# Patient Record
Sex: Male | Born: 1955 | Race: White | Hispanic: No | Marital: Married | Smoking: Never smoker
Health system: Southern US, Community
[De-identification: ages and names within clinical notes are randomized; demographics above are authoritative.]

## PROBLEM LIST (undated history)

## (undated) DIAGNOSIS — K219 Gastro-esophageal reflux disease without esophagitis: Secondary | ICD-10-CM

## (undated) DIAGNOSIS — D649 Anemia, unspecified: Secondary | ICD-10-CM

## (undated) DIAGNOSIS — Z5189 Encounter for other specified aftercare: Secondary | ICD-10-CM

## (undated) DIAGNOSIS — E785 Hyperlipidemia, unspecified: Secondary | ICD-10-CM

## (undated) DIAGNOSIS — E538 Deficiency of other specified B group vitamins: Secondary | ICD-10-CM

## (undated) DIAGNOSIS — K449 Diaphragmatic hernia without obstruction or gangrene: Secondary | ICD-10-CM

## (undated) HISTORY — DX: Gastro-esophageal reflux disease without esophagitis: K21.9

## (undated) HISTORY — DX: Hyperlipidemia, unspecified: E78.5

## (undated) HISTORY — PX: MOUTH SURGERY: SHX715

## (undated) HISTORY — DX: Diaphragmatic hernia without obstruction or gangrene: K44.9

## (undated) HISTORY — DX: Deficiency of other specified B group vitamins: E53.8

## (undated) HISTORY — DX: Anemia, unspecified: D64.9

## (undated) HISTORY — DX: Encounter for other specified aftercare: Z51.89

## (undated) HISTORY — PX: OTHER SURGICAL HISTORY: SHX169

---

## 2012-12-11 ENCOUNTER — Encounter (HOSPITAL_COMMUNITY): Payer: Self-pay | Admitting: Emergency Medicine

## 2012-12-11 ENCOUNTER — Emergency Department (INDEPENDENT_AMBULATORY_CARE_PROVIDER_SITE_OTHER)
Admission: EM | Admit: 2012-12-11 | Discharge: 2012-12-11 | Disposition: A | Discharge status: Home / Self Care | Payer: BC Managed Care – PPO | Source: Home / Self Care | Attending: Family Medicine | Admitting: Family Medicine

## 2012-12-11 DIAGNOSIS — L723 Sebaceous cyst: Secondary | ICD-10-CM

## 2012-12-11 DIAGNOSIS — L089 Local infection of the skin and subcutaneous tissue, unspecified: Secondary | ICD-10-CM

## 2012-12-11 MED ORDER — DOXYCYCLINE HYCLATE 100 MG PO CAPS: 100.0000 mg | ORAL_CAPSULE | Freq: Two times a day (BID) | ORAL | Status: DC

## 2012-12-11 NOTE — ED Notes (Signed)
Pt  Reports  Symptoms  Of   Boil  On  Neck       X  2  Week   Pt    Reports  It is  Draining  As  Well      The  Area  Is swollen tender     And  Draining

## 2012-12-11 NOTE — ED Provider Notes (Signed)
Gregory Duncan is a 57 y.o. male who presents to Urgent Care today for right sided neck skin abscess. Patient has a history of a small cyst in the right side of his neck he'll intermittently swell and resolved. However about one or 2 weeks ago it started worsening and became very red and painful. His wife was able to express white cheesy foul-smelling material from it today. He notes however that his pain is worsened. He denies any fevers or chills nausea vomiting or diarrhea. He has tried some pain medications which have not worked very well. He also has used antibiotic ointment which has not worked. He feels well otherwise with no known medical problems.   History reviewed. No pertinent past medical history. History  Substance Use Topics  . Smoking status: Not on file  . Smokeless tobacco: Not on file  . Alcohol Use: No   ROS as above Medications reviewed. No current facility-administered medications for this encounter.   Current Outpatient Prescriptions  Medication Sig Dispense Refill  . doxycycline (VIBRAMYCIN) 100 MG capsule Take 1 capsule (100 mg total) by mouth 2 (two) times daily.  20 capsule  0    Exam:  BP 161/94  Pulse 64  Temp(Src) 97.8 F (36.6 C) (Oral)  Resp 16  SpO2 99% Gen: Well NAD Skin: Large 2 cm area of fluctuance with mild surrounding induration and erythema. Tender to touch. This is located on the right side of his neck.  Exam is otherwise benign.   INCISION AND DRAINAGE Performed by: Clementeen Graham, S Consent: Verbal consent obtained. Risks and benefits: risks, benefits and alternatives were discussed Type: abscess  Body area: Right neck skin  Anesthesia: local infiltration  Incision was made with a scalpel.  Local anesthetic: lidocaine 2 % with epinephrine  Anesthetic total: 3 ml  Complexity: Simple Blunt dissection to break up loculations  Drainage: Small amount of purulent sebaceous material. The majority with serous sanguinous.   Drainage  amount: 2 mL   Packing material: 1/2 in iodoform gauze. Approximately 2 inches   Patient tolerance: Patient tolerated the procedure well with no immediate complications.      Assessment and Plan: 57 y.o. male with right neck infected sebaceous cyst. The cyst had become partially drained by the time the patient presented to the clinic.  I cultured the material and packed the wound. I recommend the patient followup with a general surgeon in about one month for definitive removal of the remaining sebaceous cyst. Additionally I will treat patient empirically with doxycycline and provided a work excuse note. Discussed warning signs or symptoms. Please see discharge instructions. Patient expresses understanding.      Rodolph Bong, MD 12/11/12 [phone]

## 2012-12-14 ENCOUNTER — Telehealth (HOSPITAL_COMMUNITY): Payer: Self-pay | Admitting: Family Medicine

## 2012-12-14 ENCOUNTER — Telehealth (HOSPITAL_COMMUNITY): Payer: Self-pay | Admitting: *Deleted

## 2012-12-14 LAB — CULTURE, ROUTINE-ABSCESS: Special Requests: NORMAL

## 2012-12-14 MED ORDER — CIPROFLOXACIN HCL 500 MG PO TABS: 500.0000 mg | ORAL_TABLET | Freq: Two times a day (BID) | ORAL | Status: DC

## 2012-12-14 NOTE — Telephone Encounter (Signed)
Message copied by Rodolph Bong on Thu Dec 14, 2012  6:25 PM ------      Message from: Vassie Moselle      Created: Thu Dec 14, 2012  3:23 PM      Regarding: lab       Abscess culture: Few Proteus Mirabilis.  Treated with Doxycycline- not on sensitivity.  Is pt. adequately treated?       Vassie Moselle      12/14/2012             ------

## 2012-12-14 NOTE — ED Notes (Signed)
Pt. called back.  Pt. verified x 2 and given results.  Pt. told to stop the Doxycycline and take the Cipro.  Pt. told where to pick up his Rx. Vassie Moselle 12/14/2012

## 2012-12-14 NOTE — ED Notes (Signed)
Culture results show Proteus.  Plan to switch to Cipro RN to call patient  Rodolph Bong, MD 12/14/12 [phone]

## 2012-12-14 NOTE — ED Notes (Signed)
Dr. Denyse Amass changed Rx. to Cipro.  I called pt. and left a message to call. Vassie Moselle 12/14/2012

## 2013-01-10 ENCOUNTER — Encounter (INDEPENDENT_AMBULATORY_CARE_PROVIDER_SITE_OTHER): Payer: Self-pay | Admitting: General Surgery

## 2013-01-10 ENCOUNTER — Ambulatory Visit (INDEPENDENT_AMBULATORY_CARE_PROVIDER_SITE_OTHER): Payer: BC Managed Care – PPO | Admitting: General Surgery

## 2013-01-10 ENCOUNTER — Telehealth (INDEPENDENT_AMBULATORY_CARE_PROVIDER_SITE_OTHER): Payer: Self-pay | Admitting: General Surgery

## 2013-01-10 VITALS — BP 120/80 | HR 56 | Temp 98.2°F | Resp 14 | Ht 68.0 in | Wt 192.0 lb

## 2013-01-10 DIAGNOSIS — L089 Local infection of the skin and subcutaneous tissue, unspecified: Secondary | ICD-10-CM

## 2013-01-10 DIAGNOSIS — L720 Epidermal cyst: Secondary | ICD-10-CM

## 2013-01-10 DIAGNOSIS — L723 Sebaceous cyst: Secondary | ICD-10-CM

## 2013-01-10 MED ORDER — DOXYCYCLINE HYCLATE 100 MG PO CAPS: 100.0000 mg | ORAL_CAPSULE | Freq: Two times a day (BID) | ORAL | Status: DC

## 2013-01-10 NOTE — Progress Notes (Signed)
Patient ID: Gregory Duncan, male   DOB: Jul 13, 1955, 57 y.o.   MRN: [id number]  Chief Complaint  Patient presents with  . Cyst    scalp    HPI Gregory Duncan is a 57 y.o. male.  HPI 57 yo WM referred by Dr Denyse Amass for evaluation of sebaceous cysts of the neck. Patient states that he has had the cyst on the right side of his neck as long as since 2005. It will generally swell and go down. It will occasionally drain spontaneously. A few weeks ago in early November it started spontaneously draining but was very tender and swollen and he went to urgent care where he was given antibiotics as well as incision of the area. He was referred here for definitive management. He states he also has a bump on the back of his neck as well. He denies any fevers or chills. He denies any weight loss. He denies lymphadenopathy. He denies injury to the area.  Past Medical History  Diagnosis Date  . GERD (gastroesophageal reflux disease)     History reviewed. No pertinent past surgical history.  History reviewed. No pertinent family history.  Social History History  Substance Use Topics  . Smoking status: Never Smoker   . Smokeless tobacco: Not on file  . Alcohol Use: No    No Known Allergies  Current Outpatient Prescriptions  Medication Sig Dispense Refill  . doxycycline (VIBRAMYCIN) 100 MG capsule Take 1 capsule (100 mg total) by mouth 2 (two) times daily.  14 capsule  0   No current facility-administered medications for this visit.    Review of Systems Review of Systems  Constitutional: Negative for fever, chills, appetite change and unexpected weight change.  HENT: Negative for congestion and trouble swallowing.   Eyes: Negative for visual disturbance.  Respiratory: Negative for chest tightness and shortness of breath.   Cardiovascular: Negative for chest pain and leg swelling.       No PND, no orthopnea, no DOE  Gastrointestinal:       See HPI  Genitourinary: Negative for dysuria and  hematuria.  Musculoskeletal: Negative.   Skin: Negative for rash.  Neurological: Negative for seizures and speech difficulty.       Denies TIAs, amaurosis fugax  Hematological: Does not bruise/bleed easily.  Psychiatric/Behavioral: Negative for behavioral problems and confusion.    Blood pressure 120/80, pulse 56, temperature 98.2 F (36.8 C), temperature source Temporal, resp. rate 14, height 5\' 8"  (1.727 m), weight 192 lb (87.091 kg).  Physical Exam Physical Exam  Vitals reviewed. Constitutional: He is oriented to person, place, and time. He appears well-developed and well-nourished. No distress.  HENT:  Head: Normocephalic and atraumatic.  Right Ear: External ear normal.  Left Ear: External ear normal.  Eyes: Conjunctivae are normal. No scleral icterus.  Neck: Normal range of motion. Neck supple. No tracheal deviation present. No thyromegaly present.    Posterior neck - firm, well circumscribed, mobile, subcu mass about 1.5cm, no cellulitis; Rt lateral neck - healed incision, about 2cm area of induration, no fluctuance. +cellulitis.   Cardiovascular: Normal rate and normal heart sounds.   Pulmonary/Chest: Effort normal and breath sounds normal. No respiratory distress. He has no wheezes.  Abdominal: He exhibits no distension.  Musculoskeletal: Normal range of motion.  Lymphadenopathy:       Head (right side): No preauricular, no posterior auricular and no occipital adenopathy present.       Head (left side): No preauricular, no posterior auricular and no  occipital adenopathy present.    He has no cervical adenopathy.       Right: No supraclavicular adenopathy present.       Left: No supraclavicular adenopathy present.  Neurological: He is alert and oriented to person, place, and time. He exhibits normal muscle tone.  Skin: Skin is warm and dry. No rash noted. He is not diaphoretic. No erythema. No pallor.  Psychiatric: He has a normal mood and affect. His behavior is normal.  Judgment and thought content normal.    Data Reviewed Dr Zollie Pee note 12/11/12  Assessment    Right lateral neck infected epidermal inclusion cyst Posterior neck epidermal inclusion cyst     Plan    These areas are consistent with epidermoid inclusion cyst also known as sebaceous cyst. The one on the right side of his neck he appears still a little bit indurated with some mild cellulitis. It does not appear to need drainage. However I think he needs oral antibiotics for it. I renewed his prescription for doxycycline. With respect to definitive management, we discussed management of sebaceous cyst. We discussed the only way to prevent a recurrence is to excise the entire cyst with its sac. We discussed the surgical staff as well as the postoperative care. We discussed the risk and benefits of surgery including but not limited to bleeding, infection, injury to surrounding structures, recurrence, anesthesia complications, wound complications, scarring and the typical postoperative recovery course. He is very anxious about proceeding with getting them excised as soon as possible. I explained that if we proceed at this time point that the one on the right side of the neck has increased risk of postoperative wound infection given his current cellulitis. Nonetheless he understands the risk and would like to proceed with surgery this week. I told him we would try to accommodate his request and get him scheduled for Friday afternoon under monitored anesthesia care. If not we will try to find another operative time next week. All of his questions were asked and answered  Mary Sella. Andrey Campanile, MD, FACS General, Bariatric, & Minimally Invasive Surgery [facility], [place]        [facility] M 01/10/2013, 9:14 AM

## 2013-01-10 NOTE — Telephone Encounter (Signed)
Pt made aware of [facility] financial obligation will call back to schedule  °

## 2013-01-10 NOTE — Patient Instructions (Signed)
Pick up antibiotic at pharmacy  Epidermal Cyst An epidermal cyst is sometimes called a sebaceous cyst, epidermal inclusion cyst, or infundibular cyst. These cysts usually contain a substance that looks "pasty" or "cheesy" and may have a bad smell. This substance is a protein called keratin. Epidermal cysts are usually found on the face, neck, or trunk. They may also occur in the vaginal area or other parts of the genitalia of both men and women. Epidermal cysts are usually small, painless, slow-growing bumps or lumps that move freely under the skin. It is important not to try to pop them. This may cause an infection and lead to tenderness and swelling. CAUSES  Epidermal cysts may be caused by a deep penetrating injury to the skin or a plugged hair follicle, often associated with acne. SYMPTOMS  Epidermal cysts can become inflamed and cause:  Redness.  Tenderness.  Increased temperature of the skin over the bumps or lumps.  Grayish-white, bad smelling material that drains from the bump or lump. DIAGNOSIS  Epidermal cysts are easily diagnosed by your caregiver during an exam. Rarely, a tissue sample (biopsy) may be taken to rule out other conditions that may resemble epidermal cysts. TREATMENT   Epidermal cysts often get better and disappear on their own. They are rarely ever cancerous.  If a cyst becomes infected, it may become inflamed and tender. This may require opening and draining the cyst. Treatment with antibiotics may be necessary. When the infection is gone, the cyst may be removed with minor surgery.  Small, inflamed cysts can often be treated with antibiotics or by injecting steroid medicines.  Sometimes, epidermal cysts become large and bothersome. If this happens, surgical removal in your caregiver's office may be necessary. HOME CARE INSTRUCTIONS  Only take over-the-counter or prescription medicines as directed by your caregiver.  Take your antibiotics as directed. Finish  them even if you start to feel better. SEEK MEDICAL CARE IF:   Your cyst becomes tender, red, or swollen.  Your condition is not improving or is getting worse.  You have any other questions or concerns. MAKE SURE YOU:  Understand these instructions.  Will watch your condition.  Will get help right away if you are not doing well or get worse. Document Released: 12/20/2003 Document Revised: 04/12/2011 Document Reviewed: 07/27/2010 [facility] Patient Information 2014 [place], [place].

## 2016-10-19 ENCOUNTER — Encounter (HOSPITAL_COMMUNITY): Payer: Self-pay | Admitting: *Deleted

## 2016-10-19 ENCOUNTER — Inpatient Hospital Stay (HOSPITAL_COMMUNITY)
Admission: EM | Admit: 2016-10-19 | Discharge: 2016-10-22 | DRG: 810 | Disposition: A | Discharge status: Home / Self Care | Payer: BLUE CROSS/BLUE SHIELD | Attending: Internal Medicine | Admitting: Internal Medicine

## 2016-10-19 DIAGNOSIS — D61818 Other pancytopenia: Secondary | ICD-10-CM | POA: Diagnosis not present

## 2016-10-19 DIAGNOSIS — K219 Gastro-esophageal reflux disease without esophagitis: Secondary | ICD-10-CM | POA: Diagnosis present

## 2016-10-19 DIAGNOSIS — R531 Weakness: Secondary | ICD-10-CM | POA: Diagnosis not present

## 2016-10-19 DIAGNOSIS — E538 Deficiency of other specified B group vitamins: Secondary | ICD-10-CM | POA: Diagnosis present

## 2016-10-19 DIAGNOSIS — Z6825 Body mass index (BMI) 25.0-25.9, adult: Secondary | ICD-10-CM

## 2016-10-19 DIAGNOSIS — D649 Anemia, unspecified: Secondary | ICD-10-CM | POA: Diagnosis present

## 2016-10-19 DIAGNOSIS — R634 Abnormal weight loss: Secondary | ICD-10-CM | POA: Diagnosis present

## 2016-10-19 LAB — PREPARE RBC (CROSSMATCH)

## 2016-10-19 LAB — CBC
HCT: 17.8 % — ABNORMAL LOW (ref 39.0–52.0)
HEMATOCRIT: 18.5 % — AB (ref 39.0–52.0)
HEMOGLOBIN: 6.6 g/dL — AB (ref 13.0–17.0)
Hemoglobin: 6.4 g/dL — CL (ref 13.0–17.0)
MCH: 38.6 pg — ABNORMAL HIGH (ref 26.0–34.0)
MCH: 38.6 pg — ABNORMAL HIGH (ref 26.0–34.0)
MCHC: 35.7 g/dL (ref 30.0–36.0)
MCHC: 36 g/dL (ref 30.0–36.0)
MCV: 108.2 fL — ABNORMAL HIGH (ref 78.0–100.0)
MCV: 107.2 fL — AB (ref 78.0–100.0)
Platelets: 40 10*3/uL — ABNORMAL LOW (ref 150–400)
Platelets: 26 10*3/uL — CL (ref 150–400)
RBC: 1.71 MIL/uL — ABNORMAL LOW (ref 4.22–5.81)
RBC: 1.66 MIL/uL — ABNORMAL LOW (ref 4.22–5.81)
RDW: 16.9 % — ABNORMAL HIGH (ref 11.5–15.5)
RDW: 17.1 % — ABNORMAL HIGH (ref 11.5–15.5)
WBC: 1.7 10*3/uL — ABNORMAL LOW (ref 4.0–10.5)
WBC: 1.3 10*3/uL — AB (ref 4.0–10.5)

## 2016-10-19 LAB — BASIC METABOLIC PANEL
Anion gap: 8 (ref 5–15)
BUN: 21 mg/dL — ABNORMAL HIGH (ref 6–20)
CO2: 27 mmol/L (ref 22–32)
Calcium: 8.8 mg/dL — ABNORMAL LOW (ref 8.9–10.3)
Chloride: 100 mmol/L — ABNORMAL LOW (ref 101–111)
Creatinine, Ser: 0.78 mg/dL (ref 0.61–1.24)
GFR calc Af Amer: >60 mL/min (ref >60)
Glucose, Bld: 127 mg/dL — ABNORMAL HIGH (ref 65–99)
POTASSIUM: 4.6 mmol/L (ref 3.5–5.1)
SODIUM: 135 mmol/L (ref 135–145)

## 2016-10-19 MED ORDER — SODIUM CHLORIDE 0.9 % IV SOLN
Freq: Once | INTRAVENOUS | Status: AC
  Administered 2016-10-20: 01:00:00 via INTRAVENOUS

## 2016-10-19 NOTE — ED Notes (Signed)
Date and time results received: 10/19/16 7:30 PM (use smartphrase ".now" to insert current time)  Test: Hbg Critical Value: 6.6  Name of Provider Notified: Dr. Oleta Mouse   Orders Received? Or Actions Taken?: verbal order type and screen

## 2016-10-19 NOTE — ED Triage Notes (Signed)
To ED for eval of weakness for the past week. Pt appears very pale. Denies pain or discomfort in abd. Denies blood in stools or dark stools. No vomiting. Does complain of pain in between shoulder blades. No cp

## 2016-10-19 NOTE — ED Provider Notes (Signed)
[facility] DEPT Provider Note   CSN: [id number] Arrival date & time: 10/19/16  1646     History   Chief Complaint Chief Complaint  Patient presents with  . Weakness    HPI Gregory Duncan is a 61 y.o. male.  Patient with PMH of GERD presents to the ED with a chief complaint of weakness.  He states that he has been weak, fatigued, and short of breath for the past several weeks. States that his symptoms have been gradually worsening.  He denies any black stools.  States that he had some vomiting in the waiting room, and has not had an appetite for the past several weeks.  He reports having some pain in his epigastrium, which he states feels similar to his GERD.   The history is provided by the patient. No language interpreter was used.    Past Medical History:  Diagnosis Date  . GERD (gastroesophageal reflux disease)     There are no active problems to display for this patient.   History reviewed. No pertinent surgical history.     Home Medications    Prior to Admission medications   Medication Sig Start Date End Date Taking? Authorizing Provider  doxycycline (VIBRAMYCIN) 100 MG capsule Take 1 capsule (100 mg total) by mouth 2 (two) times daily. 01/10/13   Greer Pickerel, MD    Family History No family history on file.  Social History Social History  Substance Use Topics  . Smoking status: Never Smoker  . Smokeless tobacco: Not on file  . Alcohol use No     Allergies   Patient has no known allergies.   Review of Systems Review of Systems  All other systems reviewed and are negative.    Physical Exam Updated Vital Signs BP 120/79   Pulse 88   Temp 99 F (37.2 C) (Oral)   Resp 15   SpO2 100%   Physical Exam  Constitutional: He is oriented to person, place, and time. He appears well-developed and well-nourished.  Pale, ill appearing  HENT:  Head: Normocephalic and atraumatic.  Eyes: Pupils are equal, round, and reactive to light.  Conjunctivae and EOM are normal. Right eye exhibits no discharge. Left eye exhibits no discharge. No scleral icterus.  Neck: Normal range of motion. Neck supple. No JVD present.  Cardiovascular: Regular rhythm and normal heart sounds.  Exam reveals no gallop and no friction rub.   No murmur heard. Mildly tachycardic  Pulmonary/Chest: Effort normal and breath sounds normal. No respiratory distress. He has no wheezes. He has no rales. He exhibits no tenderness.  Abdominal: Soft. He exhibits no distension and no mass. There is no tenderness. There is no rebound and no guarding.  Musculoskeletal: Normal range of motion. He exhibits no edema or tenderness.  Neurological: He is alert and oriented to person, place, and time.  Skin: Skin is warm and dry.  Psychiatric: He has a normal mood and affect. His behavior is normal. Judgment and thought content normal.  Nursing note and vitals reviewed.    ED Treatments / Results  Labs (all labs ordered are listed, but only abnormal results are displayed) Labs Reviewed  BASIC METABOLIC PANEL - Abnormal; Notable for the following:       Result Value   Chloride 100 (*)    Glucose, Bld 127 (*)    BUN 21 (*)    Calcium 8.8 (*)    All other components within normal limits  CBC - Abnormal; Notable for the following:  WBC 1.7 (*)    RBC 1.71 (*)    Hemoglobin 6.6 (*)    HCT 18.5 (*)    MCV 108.2 (*)    MCH 38.6 (*)    RDW 16.9 (*)    Platelets 40 (*)    All other components within normal limits  TROPONIN I - Abnormal; Notable for the following:    Troponin I 0.03 (*)    All other components within normal limits  CBC - Abnormal; Notable for the following:    WBC 1.3 (*)    RBC 1.66 (*)    Hemoglobin 6.4 (*)    HCT 17.8 (*)    MCV 107.2 (*)    MCH 38.6 (*)    RDW 17.1 (*)    Platelets 26 (*)    All other components within normal limits  URINALYSIS, ROUTINE W REFLEX MICROSCOPIC  DIFFERENTIAL  SAVE SMEAR  PATHOLOGIST SMEAR REVIEW  HIV  ANTIBODY (ROUTINE TESTING)  HEMOGLOBIN AND HEMATOCRIT, BLOOD  VITAMIN B12  IRON AND TIBC  RETICULOCYTES  FOLATE  FERRITIN  DIC (DISSEMINATED INTRAVASCULAR COAGULATION) PANEL  CBG MONITORING, ED  I-STAT CG4 LACTIC ACID, ED  I-STAT CG4 LACTIC ACID, ED  TYPE AND SCREEN  PREPARE RBC (CROSSMATCH)  ABO/RH    EKG  EKG Interpretation  Date/Time:  Tuesday October 19 2016 18:16:38 EDT Ventricular Rate:  64 PR Interval:  122 QRS Duration: 94 QT Interval:  396 QTC Calculation: 408 R Axis:   57 Text Interpretation:  Normal sinus rhythm T wave abnormality, consider inferolateral ischemia Abnormal ECG no prior EKG  Confirmed by Brantley Stage [phone]) on 10/19/2016 6:23:45 PM       Radiology No results found.  Procedures Procedures (including critical care time) CRITICAL CARE Performed by: Montine Circle   Total critical care time: 47 minutes  Critical care time was exclusive of separately billable procedures and treating other patients.  Critical care was necessary to treat or prevent imminent or life-threatening deterioration.  Critical care was time spent personally by me on the following activities: development of treatment plan with patient and/or surrogate as well as nursing, discussions with consultants, evaluation of patient's response to treatment, examination of patient, obtaining history from patient or surrogate, ordering and performing treatments and interventions, ordering and review of laboratory studies, ordering and review of radiographic studies, pulse oximetry and re-evaluation of patient's condition.  Medications Ordered in ED Medications  0.9 %  sodium chloride infusion (not administered)     Initial Impression / Assessment and Plan / ED Course  I have reviewed the triage vital signs and the nursing notes.  Pertinent labs & imaging results that were available during my care of the patient were reviewed by me and considered in my medical decision making (see  chart for details).     Patient with pancytopenia, generalized weakness x several weeks, lack of appetite.  Describes some acid reflux type symptoms, but denies any other pain.  There are no other associated symptoms.  Will transfuse the patient given his symptomatic anemia.  Appreciate Dr. Alcario Drought for admitting the patient.  Final Clinical Impressions(s) / ED Diagnoses   Final diagnoses:  Symptomatic anemia  Pancytopenia [facility] [place])    New Prescriptions New Prescriptions   No medications on file     Montine Circle, Hershal Coria 10/20/16 0210    Tegeler, Gwenyth Allegra, MD 10/20/16 [phone]

## 2016-10-20 ENCOUNTER — Emergency Department (HOSPITAL_COMMUNITY): Payer: BLUE CROSS/BLUE SHIELD

## 2016-10-20 ENCOUNTER — Encounter (HOSPITAL_COMMUNITY): Payer: Self-pay | Admitting: *Deleted

## 2016-10-20 DIAGNOSIS — E538 Deficiency of other specified B group vitamins: Secondary | ICD-10-CM

## 2016-10-20 DIAGNOSIS — D649 Anemia, unspecified: Secondary | ICD-10-CM

## 2016-10-20 DIAGNOSIS — D61818 Other pancytopenia: Secondary | ICD-10-CM | POA: Diagnosis present

## 2016-10-20 LAB — BASIC METABOLIC PANEL
ANION GAP: 6 (ref 5–15)
BUN: 17 mg/dL (ref 6–20)
CHLORIDE: 101 mmol/L (ref 101–111)
CO2: 28 mmol/L (ref 22–32)
CREATININE: 0.73 mg/dL (ref 0.61–1.24)
Calcium: 8.6 mg/dL — ABNORMAL LOW (ref 8.9–10.3)
GFR calc non Af Amer: >60 mL/min (ref >60)
Glucose, Bld: 120 mg/dL — ABNORMAL HIGH (ref 65–99)
POTASSIUM: 3.7 mmol/L (ref 3.5–5.1)
SODIUM: 135 mmol/L (ref 135–145)

## 2016-10-20 LAB — IRON AND TIBC
IRON: 205 ug/dL — AB (ref 45–182)
Saturation Ratios: 85 % — ABNORMAL HIGH (ref 17.9–39.5)
TIBC: 241 ug/dL — ABNORMAL LOW (ref 250–450)
UIBC: 36 ug/dL

## 2016-10-20 LAB — PATHOLOGIST SMEAR REVIEW

## 2016-10-20 LAB — I-STAT CG4 LACTIC ACID, ED: Lactic Acid, Venous: 1.16 mmol/L (ref 0.5–1.9)

## 2016-10-20 LAB — DIC (DISSEMINATED INTRAVASCULAR COAGULATION) PANEL
INR: 1.17
PLATELETS: 24 10*3/uL — AB (ref 150–400)
PROTHROMBIN TIME: 14.8 s (ref 11.4–15.2)

## 2016-10-20 LAB — FOLATE: FOLATE: 16.3 ng/mL (ref >5.9)

## 2016-10-20 LAB — URINALYSIS, ROUTINE W REFLEX MICROSCOPIC
BACTERIA UA: NONE SEEN
Glucose, UA: NEGATIVE mg/dL
Hgb urine dipstick: NEGATIVE
KETONES UR: 20 mg/dL — AB
Leukocytes, UA: NEGATIVE
Nitrite: NEGATIVE
PH: 5 (ref 5.0–8.0)
PROTEIN: 30 mg/dL — AB
RBC / HPF: NONE SEEN RBC/hpf (ref 0–5)
Specific Gravity, Urine: 1.023 (ref 1.005–1.030)

## 2016-10-20 LAB — RETICULOCYTES
RBC.: 1.72 MIL/uL — ABNORMAL LOW (ref 4.22–5.81)
RETIC COUNT ABSOLUTE: 15.5 10*3/uL — AB (ref 19.0–186.0)
Retic Ct Pct: 0.9 % (ref 0.4–3.1)

## 2016-10-20 LAB — DIC (DISSEMINATED INTRAVASCULAR COAGULATION)PANEL
D-Dimer, Quant: 1.01 ug/mL-FEU — ABNORMAL HIGH (ref 0.00–0.50)
Fibrinogen: 255 mg/dL (ref 210–475)
aPTT: 31 seconds (ref 24–36)

## 2016-10-20 LAB — SAVE SMEAR

## 2016-10-20 LAB — ABO/RH: ABO/RH(D): A POS

## 2016-10-20 LAB — HEMOGLOBIN AND HEMATOCRIT, BLOOD
HCT: 23.8 % — ABNORMAL LOW (ref 39.0–52.0)
Hemoglobin: 8.6 g/dL — ABNORMAL LOW (ref 13.0–17.0)

## 2016-10-20 LAB — HIV ANTIBODY (ROUTINE TESTING W REFLEX): HIV Screen 4th Generation wRfx: NONREACTIVE

## 2016-10-20 LAB — VITAMIN B12

## 2016-10-20 LAB — FERRITIN: FERRITIN: 205 ng/mL (ref 24–336)

## 2016-10-20 MED ORDER — ACETAMINOPHEN 650 MG RE SUPP: 650.0000 mg | Freq: Four times a day (QID) | RECTAL | Status: DC | PRN

## 2016-10-20 MED ORDER — ACETAMINOPHEN 325 MG PO TABS: 650.0000 mg | ORAL_TABLET | Freq: Four times a day (QID) | ORAL | Status: DC | PRN

## 2016-10-20 MED ORDER — ONDANSETRON HCL 4 MG/2ML IJ SOLN
4.0000 mg | Freq: Four times a day (QID) | INTRAMUSCULAR | Status: DC | PRN
  Administered 2016-10-21: 4 mg via INTRAVENOUS
  Filled 2016-10-20: qty 2

## 2016-10-20 MED ORDER — ENOXAPARIN SODIUM 40 MG/0.4ML ~~LOC~~ SOLN: 40.0000 mg | SUBCUTANEOUS | Status: DC

## 2016-10-20 MED ORDER — ONDANSETRON HCL 4 MG PO TABS: 4.0000 mg | ORAL_TABLET | Freq: Four times a day (QID) | ORAL | Status: DC | PRN

## 2016-10-20 MED ORDER — CYANOCOBALAMIN 1000 MCG/ML IJ SOLN
1000.0000 ug | Freq: Every day | INTRAMUSCULAR | Status: DC
  Administered 2016-10-20 – 2016-10-22 (×3): 1000 ug via INTRAMUSCULAR
  Filled 2016-10-20 (×3): qty 1

## 2016-10-20 MED ORDER — FOLIC ACID 5 MG/ML IJ SOLN
1.0000 mg | Freq: Every day | INTRAMUSCULAR | Status: DC
  Administered 2016-10-20 – 2016-10-22 (×3): 1 mg via INTRAVENOUS
  Filled 2016-10-20 (×3): qty 0.2

## 2016-10-20 NOTE — H&P (Signed)
History and Physical    Gregory Duncan XFG:[id number] DOB: 10-23-1955 DOA: 10/19/2016  PCP: Patient, No Pcp Per  Patient coming from: Home  I have personally briefly reviewed patient's old medical records in [place]  Chief Complaint: Generalized Weakness  HPI: Gregory Duncan is a 61 y.o. male with medical history significant of GERD.  Patient presents to the ED with c/o 1 week history of worsening weakness.  Apparently has insidious onset of generalized weakness and fatigue over past couple of weeks.  Worsening over past 1 week.  Nothing makes better or worse, nothing tried for symptoms.   ED Course: Pancytopenia with WBC 1.3, HGB 6.4, platelets 26.  2 unit PRBC transfusion ordered.  Macrocytosis noted.  Renal function and mental status nl.   Review of Systems: As per HPI otherwise 10 point review of systems negative.   Past Medical History:  Diagnosis Date  . GERD (gastroesophageal reflux disease)     History reviewed. No pertinent surgical history.   reports that he has never smoked. He does not have any smokeless tobacco history on file. He reports that he does not drink alcohol or use drugs.  No Known Allergies  No family history on file. No family history of bone marrow problems.  Prior to Admission medications   Medication Sig Start Date End Date Taking? Authorizing Provider  bismuth subsalicylate (PEPTO BISMOL) 262 MG/15ML suspension Take 30 mLs by mouth every 6 (six) hours as needed for indigestion.   Yes [provider]  calcium carbonate (TUMS - DOSED IN MG ELEMENTAL CALCIUM) 500 MG chewable tablet Chew 1 tablet by mouth daily as needed for indigestion or heartburn.   Yes [provider]    Physical Exam: Vitals:   10/20/16 0038 10/20/16 0045 10/20/16 0100 10/20/16 0130  BP: (!) 89/53 (!) 93/55 (!) 83/53 (!) 98/59  Pulse: 62 64 (!) 58 (!) 59  Resp: 16 12 12  (!) 9  Temp: 98.4 F (36.9 C)     TempSrc: Oral     SpO2:  99% 98% 99%      Constitutional: NAD, calm, comfortable Eyes: PERRL, lids and conjunctivae normal ENMT: Mucous membranes are moist. Posterior pharynx clear of any exudate or lesions.Normal dentition.  Neck: normal, supple, no masses, no thyromegaly Respiratory: clear to auscultation bilaterally, no wheezing, no crackles. Normal respiratory effort. No accessory muscle use.  Cardiovascular: Regular rate and rhythm, no murmurs / rubs / gallops. No extremity edema. 2+ pedal pulses. No carotid bruits.  Abdomen: no tenderness, no masses palpated. No hepatosplenomegaly. Bowel sounds positive.  Musculoskeletal: no clubbing / cyanosis. No joint deformity upper and lower extremities. Good ROM, no contractures. Normal muscle tone.  Skin: no rashes, lesions, ulcers. No induration Neurologic: CN 2-12 grossly intact. Sensation intact, DTR normal. Strength 5/5 in all 4.  Psychiatric: Normal judgment and insight. Alert and oriented x 3. Normal mood.    Labs on Admission: I have personally reviewed following labs and imaging studies  CBC:  Recent Labs Lab 10/19/16 1819 10/19/16 2241  WBC 1.7* 1.3*  HGB 6.6* 6.4*  HCT 18.5* 17.8*  MCV 108.2* 107.2*  PLT 40* 26*   Basic Metabolic Panel:  Recent Labs Lab 10/19/16 1819  NA 135  K 4.6  CL 100*  CO2 27  GLUCOSE 127*  BUN 21*  CREATININE 0.78  CALCIUM 8.8*   GFR: CrCl cannot be calculated (Unknown ideal weight.). Liver Function Tests: No results for input(s): AST, ALT, ALKPHOS, BILITOT, PROT, ALBUMIN in  the last 168 hours. No results for input(s): LIPASE, AMYLASE in the last 168 hours. No results for input(s): AMMONIA in the last 168 hours. Coagulation Profile: No results for input(s): INR, PROTIME in the last 168 hours. Cardiac Enzymes:  Recent Labs Lab 10/19/16 2241  TROPONINI 0.03*   BNP (last 3 results) No results for input(s): PROBNP in the last 8760 hours. HbA1C: No results for input(s): HGBA1C in the last 72 hours. CBG: No results  for input(s): GLUCAP in the last 168 hours. Lipid Profile: No results for input(s): CHOL, HDL, LDLCALC, TRIG, CHOLHDL, LDLDIRECT in the last 72 hours. Thyroid Function Tests: No results for input(s): TSH, T4TOTAL, FREET4, T3FREE, THYROIDAB in the last 72 hours. Anemia Panel: No results for input(s): VITAMINB12, FOLATE, FERRITIN, TIBC, IRON, RETICCTPCT in the last 72 hours. Urine analysis: No results found for: COLORURINE, APPEARANCEUR, LABSPEC, [place], GLUCOSEU, HGBUR, BILIRUBINUR, KETONESUR, PROTEINUR, UROBILINOGEN, NITRITE, LEUKOCYTESUR  Radiological Exams on Admission: Dg Chest Portable 1 View  Result Date: 10/20/2016 CLINICAL DATA:  Weakness for a week. Pain between the shoulder blades. Low white cell count and low platelets. EXAM: PORTABLE CHEST 1 VIEW COMPARISON:  None. FINDINGS: Normal heart size and pulmonary vascularity. No focal airspace disease or consolidation in the lungs. No blunting of costophrenic angles. No pneumothorax. Mediastinal contours appear intact. Calcified and tortuous aorta. IMPRESSION: No active disease.  Aortic atherosclerosis. Electronically Signed   By: Lucienne Capers M.D.   On: 10/20/2016 00:18    EKG: Independently reviewed.  Assessment/Plan Principal Problem:   Symptomatic anemia Active Problems:   Pancytopenia ([place])    1. Symptomatic anemia -  1. 2 unit PRBC transfusion ordered 2. H/h at 0700 3. Anemia pnl 4. Tele monitor 2. Pancytopenia - 1. Overall picture is concerning for possibly MDS or myelofibrosis 2. DIC panel ordered 3. Pathologist smear review and save smear ordered 4. Differential ordered 5. Needs Heme consult in AM.  DVT prophylaxis: SCDs Code Status: Full Family Communication: Family at bedside Disposition Plan: Home after admit Consults called: None Admission status: Place in obs   Garlen Reinig, [facility] Pager [phone]  If 7AM-7PM, please contact day team taking care of  patient [email or web address] Password [email or web address]  10/20/2016, 1:36 AM

## 2016-10-20 NOTE — ED Notes (Signed)
Attempted report 

## 2016-10-20 NOTE — Consult Note (Signed)
[facility] [phone]   [phone]  CONSULT NOTE  REFERRING PHYSICIAN: Dr. Cristal Ford  REASON FOR CONSULTATION:  61 years old white male with pancytopenia.  HPI Gregory Duncan is a 61 y.o. male was no significant past medical history except for GERD. The patient has been complaining of increasing fatigue and weakness over the last few months. It was getting worse in the last few weeks and the patient was unable to walk to do his job as a maintenance person at [employer] without taking some time to rest. He presented to the emergency department yesterday for evaluation. CBC performed at the emergency Department showed white blood count 1.7, hemoglobin 6.6, hematocrit 18.5% and platelets count 40,000. The patient received 2 units of PRBCs transfusion and repeat hemoglobin and hematocrit today showed hemoglobin of 8.6 and hematocrit 23.8%. The patient had several other studies including reticulocyte count which was decreased. Iron study showed elevated serum iron of 2005 with iron saturation of 55%, ferritin was normal at 205. Serum folate was normal at 16.3 but vitamin B-12 level was very low less than 50. HIV test was nonreactive. DIC panel showed only disease thrombocytopenia. I was consulted to see the patient and give recommendation regarding his condition. The patient denied having any recent viral infection or over the counter medication. He denied having any chest pain but has shortness of breath with exertion with no cough or hemoptysis. He denied having any fever or chills. He has no nausea, vomiting, diarrhea or constipation. He continues to have heartburn. He takes over-the-counter heartburn medications. The patient lost several pounds recently secondary to lack of appetite. Family history is unremarkable for any malignancy or blood disease. The patient is married and has no children. 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 He denied having  any history of smoking, alcohol or drug abuse.  HPI  Past Medical History:  Diagnosis Date  . GERD (gastroesophageal reflux disease)     History reviewed. No pertinent surgical history.  History reviewed. No pertinent family history.  Social History Social History  Substance Use Topics  . Smoking status: Never Smoker  . Smokeless tobacco: Never Used  . Alcohol use No    No Known Allergies  Current Facility-Administered Medications  Medication Dose Route Frequency Provider Last Rate Last Dose  . acetaminophen (TYLENOL) tablet 650 mg  650 mg Oral Q6H PRN Etta Quill, DO       Or  . acetaminophen (TYLENOL) suppository 650 mg  650 mg Rectal Q6H PRN Etta Quill, DO      . cyanocobalamin ((VITAMIN B-12)) injection 1,000 mcg  1,000 mcg Intramuscular Daily Cristal Ford, DO   1,000 mcg at 10/20/16 1239  . folic acid injection 1 mg  1 mg Intravenous Daily Mikhail, [place], DO   1 mg at 10/20/16 1240  . ondansetron (ZOFRAN) tablet 4 mg  4 mg Oral Q6H PRN Etta Quill, DO       Or  . ondansetron [facility]) injection 4 mg  4 mg Intravenous Q6H PRN Etta Quill, DO        Review of Systems  Constitutional: positive for anorexia, fatigue and weight loss Eyes: negative Ears, nose, mouth, throat, and face: negative Respiratory: positive for dyspnea on exertion Cardiovascular: negative Gastrointestinal: negative Genitourinary:negative Integument/breast: negative Hematologic/lymphatic: negative Musculoskeletal:negative Neurological: negative Behavioral/Psych: negative Endocrine: negative Allergic/Immunologic: negative  Physical Exam  HLK:TGYBW, healthy, no distress, well nourished and well developed SKIN: skin color, texture,  turgor are normal, no rashes or significant lesions HEAD: Normocephalic, No masses, lesions, tenderness or abnormalities EYES: normal, PERRLA, Conjunctiva are pink and non-injected EARS: External ears normal, Canals clear OROPHARYNX:no  exudate, no erythema and lips, buccal mucosa, and tongue normal  NECK: supple, no adenopathy, no JVD LYMPH:  no palpable lymphadenopathy, no hepatosplenomegaly LUNGS: clear to auscultation , and palpation HEART: regular rate & rhythm, no murmurs and no gallops ABDOMEN:abdomen soft, non-tender, normal bowel sounds and no masses or organomegaly BACK: Back symmetric, no curvature., No CVA tenderness EXTREMITIES:no joint deformities, effusion, or inflammation, no edema, no skin discoloration  NEURO: alert & oriented x 3 with fluent speech, no focal motor/sensory deficits  PERFORMANCE STATUS: ECOG 1  LABORATORY DATA: Lab Results  Component Value Date   WBC 1.3 (LL) 10/19/2016   HGB 8.6 (L) 10/20/2016   HCT 23.8 (L) 10/20/2016   MCV 107.2 (H) 10/19/2016   PLT 24 (LL) 10/20/2016    @LASTCHEM @  RADIOGRAPHIC STUDIES: Dg Chest Portable 1 View  Result Date: 10/20/2016 CLINICAL DATA:  Weakness for a week. Pain between the shoulder blades. Low white cell count and low platelets. EXAM: PORTABLE CHEST 1 VIEW COMPARISON:  None. FINDINGS: Normal heart size and pulmonary vascularity. No focal airspace disease or consolidation in the lungs. No blunting of costophrenic angles. No pneumothorax. Mediastinal contours appear intact. Calcified and tortuous aorta. IMPRESSION: No active disease.  Aortic atherosclerosis. Electronically Signed   By: Lucienne Capers M.D.   On: 10/20/2016 00:18    ASSESSMENT:This is a very pleasant 61 years old white male presented with severe pancytopenia. This is likely secondary to severe vitamin B 12 deficiency but other bone marrow infiltrative or dysplastic process could not be excluded at this point. Review of the peripheral blood smear today showed significant pancytopenia with hypersegmented neutrophils consistent with the vitamin B 12 deficiency.  PLAN: I had a lengthy discussion with the patient today about his condition and treatment options. I recommended for the  patient to start treatment with vitamin B 12 1000 g intramuscular on daily basis for the next few days then weekly for at least 4 weeks followed by monthly injection. The patient may also benefit from evaluation by gastroenterology to rule out any gastric abnormality especially with his history of persistent GERD. The patient has no improvement in his condition with the vitamin B-12 injections over the next few days, he would be considered for CT-guided bone marrow biopsy and aspirate to rule out any myelodysplastic or myeloproliferative disorder. I'll be happy to arrange for the patient a follow-up appointment with me at the [facility] for further evaluation and management of his condition after discharge. He will also need to establish care with a primary care physician since he has not seen Dr. Arnoldo Morale for few years.  The patient voices understanding of current disease status and treatment options and is in agreement with the current care plan.  All questions were answered. The patient knows to call the clinic with any problems, questions or concerns. We can certainly see the patient much sooner if necessary.  Thank you so much for allowing me to participate in the care of Gregory Duncan. I will continue to follow up the patient with you and assist in his care.  Disclaimer: This note was dictated with voice recognition software. Similar sounding words can inadvertently be transcribed and may not be corrected upon review.   Eilleen Kempf October 20, 2016, 6:12 PM

## 2016-10-20 NOTE — Progress Notes (Signed)
  PROGRESS NOTE    Gregory Duncan  DXI:[id number] DOB: 12-04-1955 DOA: 10/19/2016 PCP: Patient, No Pcp Per   Chief Complaint  Patient presents with  . Weakness    Brief Narrative:  HPI on 10/20/2016 by Dr. Jake Bathe is a 61 y.o. male with medical history significant of GERD.  Patient presents to the ED with c/o 1 week history of worsening weakness.  Apparently has insidious onset of generalized weakness and fatigue over past couple of weeks.  Worsening over past 1 week.  Nothing makes better or worse, nothing tried for symptoms. Assessment & Plan   Admitted earlier today by Dr. Jennette Kettle, see H&P for full details.  Symptomatica anemia/Pancyctopenia  -presented with dizziness and fatigue -Please see 1.3, platelets 24, hemoglobin 6.4 -Peripheral smear showed schistocytes -DIC panel only positive for d-dimer of 1.01, normal fibrinogen and PTT/PT/INR -UA and chest x-ray unremarkable for infection -Anemia panel showed iron of 205, ferritin 205, saturation ratio 85 -Folate 16.3, ferritin and B-12 less than 50 -Transfused 2uPRBCs -Hematology oncology consulted and appreciated -Continue to monitor CBC  Vitamin B 12 deficiency -As noted above, vitamin B 12 less than 50 -Will order cyanocobalamin injections  DVT Prophylaxis  SCDs  Code Status: Full  Family Communication: None at bedside  Disposition Plan: Observation  Consultants Hem/Onc  Procedures  None   LOS: 0 days   Time Spent in minutes   30 minutes  Azaylah Stailey D.O. on 10/20/2016 at 4:02 PM  Between 7am to 7pm - Pager - [phone]  After 7pm go to [email or web address] - password [email or web address]  And look for the night coverage person covering for me after hours  [facility] Office  [phone]

## 2016-10-21 ENCOUNTER — Observation Stay (HOSPITAL_COMMUNITY): Payer: BLUE CROSS/BLUE SHIELD

## 2016-10-21 DIAGNOSIS — R531 Weakness: Secondary | ICD-10-CM | POA: Diagnosis present

## 2016-10-21 DIAGNOSIS — D61818 Other pancytopenia: Secondary | ICD-10-CM | POA: Diagnosis not present

## 2016-10-21 DIAGNOSIS — R634 Abnormal weight loss: Secondary | ICD-10-CM

## 2016-10-21 DIAGNOSIS — K219 Gastro-esophageal reflux disease without esophagitis: Secondary | ICD-10-CM | POA: Diagnosis present

## 2016-10-21 DIAGNOSIS — Z6825 Body mass index (BMI) 25.0-25.9, adult: Secondary | ICD-10-CM | POA: Diagnosis not present

## 2016-10-21 DIAGNOSIS — R110 Nausea: Secondary | ICD-10-CM | POA: Diagnosis not present

## 2016-10-21 DIAGNOSIS — E538 Deficiency of other specified B group vitamins: Secondary | ICD-10-CM | POA: Diagnosis not present

## 2016-10-21 DIAGNOSIS — D649 Anemia, unspecified: Secondary | ICD-10-CM | POA: Diagnosis not present

## 2016-10-21 LAB — BASIC METABOLIC PANEL
Anion gap: 11 (ref 5–15)
BUN: 17 mg/dL (ref 6–20)
CHLORIDE: 105 mmol/L (ref 101–111)
CO2: 21 mmol/L — ABNORMAL LOW (ref 22–32)
Calcium: 8.3 mg/dL — ABNORMAL LOW (ref 8.9–10.3)
Creatinine, Ser: 0.69 mg/dL (ref 0.61–1.24)
GFR calc Af Amer: >60 mL/min (ref >60)
GFR calc non Af Amer: >60 mL/min (ref >60)
GLUCOSE: 100 mg/dL — AB (ref 65–99)
POTASSIUM: 4.3 mmol/L (ref 3.5–5.1)
Sodium: 137 mmol/L (ref 135–145)

## 2016-10-21 LAB — CBC WITH DIFFERENTIAL/PLATELET
BASOS PCT: 0 %
Basophils Absolute: 0 10*3/uL (ref 0.0–0.1)
EOS PCT: 1 %
Eosinophils Absolute: 0 10*3/uL (ref 0.0–0.7)
HEMATOCRIT: 21.4 % — AB (ref 39.0–52.0)
Hemoglobin: 7.4 g/dL — ABNORMAL LOW (ref 13.0–17.0)
LYMPHS ABS: 0.9 10*3/uL (ref 0.7–4.0)
Lymphocytes Relative: 62 %
MCH: 34.3 pg — ABNORMAL HIGH (ref 26.0–34.0)
MCHC: 34.6 g/dL (ref 30.0–36.0)
MCV: 99.1 fL (ref 78.0–100.0)
Monocytes Absolute: 0.1 10*3/uL (ref 0.1–1.0)
Monocytes Relative: 7 %
NEUTROS ABS: 0.4 10*3/uL — AB (ref 1.7–7.7)
Neutrophils Relative %: 30 %
Platelets: 21 10*3/uL — CL (ref 150–400)
RBC: 2.16 MIL/uL — ABNORMAL LOW (ref 4.22–5.81)
RDW: 21.3 % — AB (ref 11.5–15.5)
WBC: 1.4 10*3/uL — CL (ref 4.0–10.5)

## 2016-10-21 LAB — CBC
HCT: 21.9 % — ABNORMAL LOW (ref 39.0–52.0)
HEMOGLOBIN: 8 g/dL — AB (ref 13.0–17.0)
MCH: 36.2 pg — AB (ref 26.0–34.0)
MCHC: 36.5 g/dL — ABNORMAL HIGH (ref 30.0–36.0)
MCV: 99.1 fL (ref 78.0–100.0)
PLATELETS: 25 10*3/uL — AB (ref 150–400)
RBC: 2.21 MIL/uL — ABNORMAL LOW (ref 4.22–5.81)
RDW: 21.8 % — ABNORMAL HIGH (ref 11.5–15.5)
WBC: 2.1 10*3/uL — AB (ref 4.0–10.5)

## 2016-10-21 LAB — PREPARE RBC (CROSSMATCH)

## 2016-10-21 MED ORDER — IOPAMIDOL (ISOVUE-300) INJECTION 61%: 15.0000 mL | INTRAVENOUS | Status: AC

## 2016-10-21 MED ORDER — IOPAMIDOL (ISOVUE-300) INJECTION 61%
INTRAVENOUS | Status: AC
  Administered 2016-10-21: 100 mL
  Filled 2016-10-21: qty 100

## 2016-10-21 MED ORDER — SODIUM CHLORIDE 0.9 % IV SOLN: Freq: Once | INTRAVENOUS | Status: DC

## 2016-10-21 NOTE — Plan of Care (Signed)
Problem: Tissue Perfusion: Goal: Risk factors for ineffective tissue perfusion will decrease Outcome: Progressing Maintains oxygen saturation 100% on room air

## 2016-10-21 NOTE — Discharge Summary (Signed)
Physician Discharge Summary  Gregory Duncan ZOX:[id number] DOB: November 07, 1955 DOA: 10/19/2016  PCP: Patient, No Pcp Per  Admit date: 10/19/2016 Discharge date: 10/22/2016  Time spent: 45 minutes  Recommendations for Outpatient Follow-up:  Patient will be discharged to home.  Patient will need to follow up with primary care provider within one week of discharge, repeat CBC. Follow up with Dr. Julien Nordmann, hematologist, in 1-2 weeks.  Patient should continue medications as prescribed.  Patient should follow a regular diet.   Discharge Diagnoses:  Symptomatic anemia Pancytopenia Vitamin B12 deficiency  Nausea Weight loss  Discharge Condition: Stable  Diet recommendation: regular  Filed Weights   10/20/16 1547 10/21/16 0451 10/22/16 0439  Weight: 73.3 kg (161 lb 9.6 oz) 72.6 kg (160 lb) 74.8 kg (165 lb)    History of present illness:  on 10/20/2016 by Dr. Cleon Gustin Coltraneis a 61 y.o.malewith medical history significant of GERD. Patient presents to the ED with c/o 1 week history of worsening weakness. Apparently has insidious onset of generalized weakness and fatigue over past couple of weeks. Worsening over past 1 week. Nothing makes better or worse, nothing tried for symptoms.  Hospital Course:  Symptomatica anemia/Pancyctopenia  -presented with dizziness and fatigue -WBC 1.3, platelets 24, hemoglobin 6.4 on admission -Peripheral smear showed schistocytes -DIC panel only positive for d-dimer of 1.01, normal fibrinogen and PTT/PT/INR -UA and chest x-ray unremarkable for infection -Anemia panel showed iron of 205, ferritin 205, saturation ratio 85 -Folate 16.3, ferritin and B-12 less than 50 -Transfused 4uPRBCs during admission, hemoglobin currently 10.2 -WBC 2.5, platelets 21 today -Hematology oncology consulted and appreciated. Discussed with Dr. Julien Nordmann and he will follow up as an outpatient. Recommended continuing IM B12 injections for a few days, followed by  weekly IM injections for at least 4 weeks.  -Have asked for RN to teach patient how to administer B12 injections.   Vitamin B 12 deficiency -As noted above, vitamin B 12 less than 50 -Continue cyanocobalamin injections  Weight loss -Obtained CT chest/abd/pelvis: unremarkable for malignancy -Nutrition consulted  Nausea -improved, will discharge with PRN antiemetics   Procedures: None  Consultations: Hematology/Oncology  Discharge Exam: Vitals:   10/22/16 0025 10/22/16 0439  BP: 116/72 (!) 94/58  Pulse: 60 (!) 54  Resp: 18 18  Temp: 99.6 F (37.6 C) 98.5 F (36.9 C)  SpO2: 100% 100%   Patient feeling mildly better today, no longer feeling weak. Feels appetite has improved. Would like to follow up with a different PCP. Denies chest pain, shortness of breath, abdominal pain, nausea, vomiting, diarrhea, constipation, headache.    General: Well developed, well nourished, NAD, appears stated age  61: NCAT, mucous membranes moist.  Cardiovascular: S1 S2 auscultated, no murmurs, RRR  Respiratory: Clear to auscultation bilaterally with equal chest rise  Abdomen: Soft, nontender, nondistended, + bowel sounds  Extremities: warm dry without cyanosis clubbing or edema  Neuro: AAOx3, nonfocal  Psych: Appropriate mood and affect  Discharge Instructions Discharge Instructions    Discharge instructions    Complete by:  As directed    Patient will be discharged to home.  Patient will need to follow up with primary care provider within one week of discharge, repeat CBC. Follow up with Dr. Julien Nordmann, hematologist, in 1-2 weeks.  Patient should continue medications as prescribed.  Patient should follow a regular diet. You may also want to follow up with gastroenterology for your history of acid reflux.     Current Discharge Medication List    START  taking these medications   Details  cyanocobalamin (,VITAMIN B-12,) 1000 MCG/ML injection Use daily injections for 4 days, then  once per week for 4 weeks. Qty: 8 mL, Refills: 0    folic acid (FOLVITE) 956 MCG tablet Take 1 tablet (400 mcg total) by mouth daily. Qty: 30 tablet, Refills: 0    Insulin Syringe-Needle U-100 (RELI-ON INS SYR 1CC/30G) 30G 1 ML MISC Use with B12 injections. Qty: 8 each, Refills: 0    Syringe, Disposable, 1 ML MISC Use with B12 injections Qty: 8 each, Refills: 0      CONTINUE these medications which have NOT CHANGED   Details  bismuth subsalicylate (PEPTO BISMOL) 262 MG/15ML suspension Take 30 mLs by mouth every 6 (six) hours as needed for indigestion.    calcium carbonate (TUMS - DOSED IN MG ELEMENTAL CALCIUM) 500 MG chewable tablet Chew 1 tablet by mouth daily as needed for indigestion or heartburn.       No Known Allergies Follow-up Information    Dorena Dew, FNP Follow up on 11/12/2016.   Specialty:  Family Medicine Why:  at 1 pm; please try to keep your apt or call to reschedule Contact information: [address] [place] [address] [phone]        Curt Bears, MD. Schedule an appointment as soon as possible for a visit in 1 week(s).   Specialty:  Oncology Why:  Hospital follow up Contact information: [place] [address] [facility]. Schedule an appointment as soon as possible for a visit in 1 week(s).   Specialty:  Gastroenterology Why:  history of acid reflux Contact information: [address] [place] [address] [phone]           The results of significant diagnostics from this hospitalization (including imaging, microbiology, ancillary and laboratory) are listed below for reference.    Significant Diagnostic Studies: Ct Chest W Contrast  Result Date: 10/21/2016 CLINICAL DATA:  Weakness, fatigue. EXAM: CT CHEST, ABDOMEN, AND PELVIS WITH CONTRAST TECHNIQUE: Multidetector CT imaging of the chest, abdomen and pelvis was performed  following the standard protocol during bolus administration of intravenous contrast. CONTRAST:  173mL ISOVUE-300 IOPAMIDOL (ISOVUE-300) INJECTION 61% COMPARISON:  None. FINDINGS: CT CHEST FINDINGS Cardiovascular: Atherosclerosis of thoracic aorta is noted without aneurysm or dissection. Coronary artery calcifications are noted. No pericardial effusion is noted. Mediastinum/Nodes: No enlarged mediastinal, hilar, or axillary lymph nodes. Thyroid gland, trachea, and esophagus demonstrate no significant findings. Lungs/Pleura: Lungs are clear. No pleural effusion or pneumothorax. Musculoskeletal: No chest wall mass or suspicious bone lesions identified. CT ABDOMEN PELVIS FINDINGS Hepatobiliary: Solitary gallstone is noted. The liver is unremarkable. Pancreas: Unremarkable. No pancreatic ductal dilatation or surrounding inflammatory changes. Spleen: Normal in size without focal abnormality. Adrenals/Urinary Tract: Adrenal glands are unremarkable. Kidneys are normal, without renal calculi, focal lesion, or hydronephrosis. Bladder is unremarkable. Stomach/Bowel: Stomach is within normal limits. Appendix appears normal. No evidence of bowel wall thickening, distention, or inflammatory changes. Sigmoid diverticulosis is noted without inflammation. Vascular/Lymphatic: Aortic atherosclerosis. No enlarged abdominal or pelvic lymph nodes. Reproductive: Prostate is unremarkable. Other: No abdominal wall hernia or abnormality. No abdominopelvic ascites. Musculoskeletal: No acute or significant osseous findings. IMPRESSION: Aortic atherosclerosis. Coronary artery calcifications are noted consistent coronary artery disease. Solitary gallstone is noted. Sigmoid diverticulosis without inflammation. No other significant abnormality seen in the abdomen or pelvis. Electronically Signed   By: Marijo Conception, M.D.  On: 10/21/2016 21:00   Ct Abdomen Pelvis W Contrast  Result Date: 10/21/2016 CLINICAL DATA:  Weakness, fatigue. EXAM: CT  CHEST, ABDOMEN, AND PELVIS WITH CONTRAST TECHNIQUE: Multidetector CT imaging of the chest, abdomen and pelvis was performed following the standard protocol during bolus administration of intravenous contrast. CONTRAST:  139mL ISOVUE-300 IOPAMIDOL (ISOVUE-300) INJECTION 61% COMPARISON:  None. FINDINGS: CT CHEST FINDINGS Cardiovascular: Atherosclerosis of thoracic aorta is noted without aneurysm or dissection. Coronary artery calcifications are noted. No pericardial effusion is noted. Mediastinum/Nodes: No enlarged mediastinal, hilar, or axillary lymph nodes. Thyroid gland, trachea, and esophagus demonstrate no significant findings. Lungs/Pleura: Lungs are clear. No pleural effusion or pneumothorax. Musculoskeletal: No chest wall mass or suspicious bone lesions identified. CT ABDOMEN PELVIS FINDINGS Hepatobiliary: Solitary gallstone is noted. The liver is unremarkable. Pancreas: Unremarkable. No pancreatic ductal dilatation or surrounding inflammatory changes. Spleen: Normal in size without focal abnormality. Adrenals/Urinary Tract: Adrenal glands are unremarkable. Kidneys are normal, without renal calculi, focal lesion, or hydronephrosis. Bladder is unremarkable. Stomach/Bowel: Stomach is within normal limits. Appendix appears normal. No evidence of bowel wall thickening, distention, or inflammatory changes. Sigmoid diverticulosis is noted without inflammation. Vascular/Lymphatic: Aortic atherosclerosis. No enlarged abdominal or pelvic lymph nodes. Reproductive: Prostate is unremarkable. Other: No abdominal wall hernia or abnormality. No abdominopelvic ascites. Musculoskeletal: No acute or significant osseous findings. IMPRESSION: Aortic atherosclerosis. Coronary artery calcifications are noted consistent coronary artery disease. Solitary gallstone is noted. Sigmoid diverticulosis without inflammation. No other significant abnormality seen in the abdomen or pelvis. Electronically Signed   By: Marijo Conception, M.D.    On: 10/21/2016 21:00   Dg Chest Portable 1 View  Result Date: 10/20/2016 CLINICAL DATA:  Weakness for a week. Pain between the shoulder blades. Low white cell count and low platelets. EXAM: PORTABLE CHEST 1 VIEW COMPARISON:  None. FINDINGS: Normal heart size and pulmonary vascularity. No focal airspace disease or consolidation in the lungs. No blunting of costophrenic angles. No pneumothorax. Mediastinal contours appear intact. Calcified and tortuous aorta. IMPRESSION: No active disease.  Aortic atherosclerosis. Electronically Signed   By: Lucienne Capers M.D.   On: 10/20/2016 00:18    Microbiology: No results found for this or any previous visit (from the past 240 hour(s)).   Labs: Basic Metabolic Panel:  Recent Labs Lab 10/19/16 1819 10/20/16 1551 10/21/16 0220 10/22/16 0410  NA 135 135 137 137  K 4.6 3.7 4.3 3.5  CL 100* 101 105 105  CO2 27 28 21* 24  GLUCOSE 127* 120* 100* 130*  BUN 21* 17 17 8   CREATININE 0.78 0.73 0.69 0.70  CALCIUM 8.8* 8.6* 8.3* 8.2*   Liver Function Tests: No results for input(s): AST, ALT, ALKPHOS, BILITOT, PROT, ALBUMIN in the last 168 hours. No results for input(s): LIPASE, AMYLASE in the last 168 hours. No results for input(s): AMMONIA in the last 168 hours. CBC:  Recent Labs Lab 10/19/16 1819 10/19/16 2241 10/20/16 0235 10/20/16 1551 10/21/16 0220 10/21/16 1202 10/22/16 0410  WBC 1.7* 1.3*  --   --  2.1* 1.4* 2.5*  NEUTROABS  --   --   --   --   --  0.4*  --   HGB 6.6* 6.4*  --  8.6* 8.0* 7.4* 10.2*  HCT 18.5* 17.8*  --  23.8* 21.9* 21.4* 28.8*  MCV 108.2* 107.2*  --   --  99.1 99.1 94.4  PLT 40* 26* 24*  --  25* 21* 21*   Cardiac Enzymes:  Recent Labs Lab 10/19/16 2241  TROPONINI  0.03*   BNP: BNP (last 3 results) No results for input(s): BNP in the last 8760 hours.  ProBNP (last 3 results) No results for input(s): PROBNP in the last 8760 hours.  CBG: No results for input(s): GLUCAP in the last 168  hours.     SignedCristal Ford  [facility] 10/22/2016, 11:56 AM

## 2016-10-21 NOTE — Progress Notes (Addendum)
PROGRESS NOTE    Gregory Duncan  PJS:[id number] DOB: 1955-10-12 DOA: 10/19/2016 PCP: Patient, No Pcp Per   Chief Complaint  Patient presents with  . Weakness    Brief Narrative:   Assessment & Plan   Symptomatica anemia/Pancyctopenia  -presented with dizziness and fatigue -WBC 1.3, platelets 24, hemoglobin 6.4 -Peripheral smear showed schistocytes -DIC panel only positive for d-dimer of 1.01, normal fibrinogen and PTT/PT/INR -UA and chest x-ray unremarkable for infection -Anemia panel showed iron of 205, ferritin 205, saturation ratio 85 -Folate 16.3, ferritin and B-12 less than 50 -Transfused 2uPRBCs, hemoglobin was up to 8, but now back to 7.4 -WBC 2.1, platelets 25 -Hematology oncology consulted and appreciated. Discussed with Dr. Julien Nordmann, feels this may be due to Vit B12 def and recommended to continue injections for a few days, followed by weekly for at least 4 weeks. If no improvement, then consider CT guided bone marrow biopsy. Recommended GI follow up.  -Continue to monitor CBC -will transfuse 2 additional units today  Vitamin B 12 deficiency -As noted above, vitamin B 12 less than 50 -Continue cyanocobalamin injections  Weight loss -will order CT Chest/Abd/pelvis -will consult nutrition  Nausea -continue zofran PRN  DVT Prophylaxis  SCDs  Code Status: Full  Family Communication: None at bedside  Disposition Plan: Observation. Likely discharge to home on 10/22/16  Consultants Hematology/oncology, Dr. Julien Nordmann  Procedures  None  Antibiotics   Anti-infectives    None      Subjective:   Gregory Duncan seen and examined today. Continues to feel weak. Denies headache, dizziness, chest pain, shortness of breath, abdominal pain, N/V/D/C.   Objective:   Vitals:   10/20/16 1942 10/21/16 0019 10/21/16 0451 10/21/16 0810  BP: (!) 94/55 (!) 93/55 (!) 96/59 (!) 92/51  Pulse: 61 61 (!) 57 (!) 59  Resp: 18 18 18 18   Temp: 99.4 F (37.4 C) 98.9 F (37.2  C) 98.6 F (37 C) 98.4 F (36.9 C)  TempSrc: Oral Oral Oral Oral  SpO2: 100% 99% 99% 98%  Weight:   72.6 kg (160 lb)   Height:        Intake/Output Summary (Last 24 hours) at 10/21/16 1128 Last data filed at 10/20/16 2329  Gross per 24 hour  Intake              240 ml  Output              525 ml  Net             -285 ml   Filed Weights   10/20/16 1547 10/21/16 0451  Weight: 73.3 kg (161 lb 9.6 oz) 72.6 kg (160 lb)    Exam  General: Well developed, well nourished, NAD, appears stated age  HEENT: NCAT, mucous membranes moist.   Cardiovascular: S1 S2 auscultated, RRR, no murmurs  Respiratory: Clear to auscultation bilaterally with equal chest rise  Abdomen: Soft, nontender, nondistended, + bowel sounds  Extremities: warm dry without cyanosis clubbing or edema  Neuro: AAOx3, nonfocal  Psych: Appropriate    Data Reviewed: I have personally reviewed following labs and imaging studies  CBC:  Recent Labs Lab 10/19/16 1819 10/19/16 2241 10/20/16 0235 10/20/16 1551 10/21/16 0220  WBC 1.7* 1.3*  --   --  2.1*  HGB 6.6* 6.4*  --  8.6* 8.0*  HCT 18.5* 17.8*  --  23.8* 21.9*  MCV 108.2* 107.2*  --   --  99.1  PLT 40* 26* 24*  --  25*  Basic Metabolic Panel:  Recent Labs Lab 10/19/16 1819 10/20/16 1551 10/21/16 0220  NA 135 135 137  K 4.6 3.7 4.3  CL 100* 101 105  CO2 27 28 21*  GLUCOSE 127* 120* 100*  BUN 21* 17 17  CREATININE 0.78 0.73 0.69  CALCIUM 8.8* 8.6* 8.3*   GFR: Estimated Creatinine Clearance: 91.8 mL/min (by C-G formula based on SCr of 0.69 mg/dL). Liver Function Tests: No results for input(s): AST, ALT, ALKPHOS, BILITOT, PROT, ALBUMIN in the last 168 hours. No results for input(s): LIPASE, AMYLASE in the last 168 hours. No results for input(s): AMMONIA in the last 168 hours. Coagulation Profile:  Recent Labs Lab 10/20/16 0235  INR 1.17   Cardiac Enzymes:  Recent Labs Lab 10/19/16 2241  TROPONINI 0.03*   BNP (last 3  results) No results for input(s): PROBNP in the last 8760 hours. HbA1C: No results for input(s): HGBA1C in the last 72 hours. CBG: No results for input(s): GLUCAP in the last 168 hours. Lipid Profile: No results for input(s): CHOL, HDL, LDLCALC, TRIG, CHOLHDL, LDLDIRECT in the last 72 hours. Thyroid Function Tests: No results for input(s): TSH, T4TOTAL, FREET4, T3FREE, THYROIDAB in the last 72 hours. Anemia Panel:  Recent Labs  10/20/16 0235  VITAMINB12 <50*  FOLATE 16.3  FERRITIN 205  TIBC 241*  IRON 205*  RETICCTPCT 0.9   Urine analysis:    Component Value Date/Time   COLORURINE AMBER (A) 10/20/2016 0315   APPEARANCEUR HAZY (A) 10/20/2016 0315   LABSPEC 1.023 10/20/2016 0315   PHURINE 5.0 10/20/2016 0315   GLUCOSEU NEGATIVE 10/20/2016 0315   HGBUR NEGATIVE 10/20/2016 0315   BILIRUBINUR SMALL (A) 10/20/2016 0315   KETONESUR 20 (A) 10/20/2016 0315   PROTEINUR 30 (A) 10/20/2016 0315   NITRITE NEGATIVE 10/20/2016 0315   LEUKOCYTESUR NEGATIVE 10/20/2016 0315   Sepsis Labs: @LABRCNTIP (procalcitonin:4,lacticidven:4)  )No results found for this or any previous visit (from the past 240 hour(s)).    Radiology Studies: Dg Chest Portable 1 View  Result Date: 10/20/2016 CLINICAL DATA:  Weakness for a week. Pain between the shoulder blades. Low white cell count and low platelets. EXAM: PORTABLE CHEST 1 VIEW COMPARISON:  None. FINDINGS: Normal heart size and pulmonary vascularity. No focal airspace disease or consolidation in the lungs. No blunting of costophrenic angles. No pneumothorax. Mediastinal contours appear intact. Calcified and tortuous aorta. IMPRESSION: No active disease.  Aortic atherosclerosis. Electronically Signed   By: Lucienne Capers M.D.   On: 10/20/2016 00:18     Scheduled Meds: . cyanocobalamin  1,000 mcg Intramuscular Daily  . folic acid  1 mg Intravenous Daily   Continuous Infusions:   LOS: 0 days   Time Spent in minutes   30 minutes  Gregory Duncan,  Cricket Goodlin D.O. on 10/21/2016 at 11:28 AM  Between 7am to 7pm - Pager - [phone]  After 7pm go to [email or web address] - password [email or web address]  And look for the night coverage person covering for me after hours  [facility] Office  [phone]

## 2016-10-21 NOTE — Progress Notes (Signed)
Patient stable during 7 a to 7 p shift, did complain of nausea earlier in the shift, resolved with IV Zofran.  VSS, received 2 units of PRBC's this shift and drank contrast in preparation for CT of abdomen.  Family at bedside.

## 2016-10-22 ENCOUNTER — Telehealth: Payer: Self-pay | Admitting: *Deleted

## 2016-10-22 ENCOUNTER — Encounter: Payer: Self-pay | Admitting: Physician Assistant

## 2016-10-22 DIAGNOSIS — R110 Nausea: Secondary | ICD-10-CM

## 2016-10-22 LAB — TYPE AND SCREEN
ABO/RH(D): A POS
Antibody Screen: NEGATIVE
Unit division: 0
Unit division: 0
Unit division: 0
Unit division: 0

## 2016-10-22 LAB — BPAM RBC
Blood Product Expiration Date: 201810012359
Blood Product Expiration Date: 201810062359
Blood Product Expiration Date: 201810082359
Blood Product Expiration Date: 201810082359
ISSUE DATE / TIME: 201809190248
ISSUE DATE / TIME: 201809201639
ISSUE DATE / TIME: 201809190023
ISSUE DATE / TIME: 201809201403
UNIT TYPE AND RH: 6200
Unit Type and Rh: 6200
Unit Type and Rh: 6200
Unit Type and Rh: 6200

## 2016-10-22 LAB — BASIC METABOLIC PANEL
ANION GAP: 8 (ref 5–15)
BUN: 8 mg/dL (ref 6–20)
CALCIUM: 8.2 mg/dL — AB (ref 8.9–10.3)
CO2: 24 mmol/L (ref 22–32)
Chloride: 105 mmol/L (ref 101–111)
Creatinine, Ser: 0.7 mg/dL (ref 0.61–1.24)
GFR calc Af Amer: >60 mL/min (ref >60)
GLUCOSE: 130 mg/dL — AB (ref 65–99)
POTASSIUM: 3.5 mmol/L (ref 3.5–5.1)
SODIUM: 137 mmol/L (ref 135–145)

## 2016-10-22 LAB — CBC
HEMATOCRIT: 28.8 % — AB (ref 39.0–52.0)
HEMOGLOBIN: 10.2 g/dL — AB (ref 13.0–17.0)
MCH: 33.4 pg (ref 26.0–34.0)
MCHC: 35.4 g/dL (ref 30.0–36.0)
MCV: 94.4 fL (ref 78.0–100.0)
Platelets: 21 10*3/uL — CL (ref 150–400)
RBC: 3.05 MIL/uL — ABNORMAL LOW (ref 4.22–5.81)
RDW: 20.6 % — AB (ref 11.5–15.5)
WBC: 2.5 10*3/uL — AB (ref 4.0–10.5)

## 2016-10-22 MED ORDER — SYRINGE (DISPOSABLE) 1 ML MISC: 0 refills | Status: DC

## 2016-10-22 MED ORDER — CYANOCOBALAMIN 1000 MCG/ML IJ SOLN: INTRAMUSCULAR | 0 refills | Status: DC

## 2016-10-22 MED ORDER — INSULIN SYRINGE-NEEDLE U-100 30G 1 ML MISC: 0 refills | Status: AC

## 2016-10-22 MED ORDER — FOLIC ACID 400 MCG PO TABS: 400.0000 ug | ORAL_TABLET | Freq: Every day | ORAL | 0 refills | Status: DC

## 2016-10-22 NOTE — Discharge Instructions (Signed)
Vitamin B12 Deficiency Vitamin B12 deficiency means that your body is not getting enough vitamin B12. Your body needs vitamin B12 for important bodily functions. If you do not have enough vitamin B12 in your body, you can have health problems. Follow these instructions at home:  Take supplements only as told by your doctor. Follow the directions carefully.  Get any shots (injections) as told by your doctor. Do not miss your visits to the doctor.  Eat lots of healthy foods that contain vitamin B12. Ask your doctor if you should work with someone who is trained in how food affects health (dietitian). Foods that contain vitamin B12 include: ? Meat. ? Meat from birds (poultry). ? Fish. ? Eggs. ? Cereal and dairy products that are fortified. This means that vitamin B12 has been added to the food. Check the label on the package to see if the food is fortified.  Do not drink too much (do not abuse) alcohol.  Keep all follow-up visits as told by your doctor. This is important. Contact a doctor if:  Your symptoms come back. Get help right away if:  You have trouble breathing.  You have chest pain.  You get dizzy.  You pass out (lose consciousness). This information is not intended to replace advice given to you by your health care provider. Make sure you discuss any questions you have with your health care provider. Document Released: 01/07/2011 Document Revised: 06/26/2015 Document Reviewed: 06/05/2014 Elsevier Interactive Patient Education  2018 [employer]. Pancytopenia Pancytopenia is a condition in which there is an abnormally low amount (deficiency) of the following blood cells:  Red blood cells (RBCs). Having too few RBCs is called anemia.  White blood cells (WBCs). Having too few WBCs is called leukopenia.  Cells that help the blood clot (platelets). Having too few platelets is called thrombocytopenia.  Cells that become blood cells (stem cells) are made in the soft tissue  inside the bones (bone marrow). All blood cells have a limited lifespan. Blood cells are constantly replaced with new blood cells from the bone marrow. Pancytopenia can be caused by any condition or disease that:  Destroys the ability of bone marrow to make blood cells.  Causes bone marrow to make blood cellsthat cannot survive after they leave the bone marrow.  What are the causes? There are many possible causes of this condition. In some cases, the cause is not known. Common causes include:  A disease that causes bone marrow to make immature blood cells (megaloblastic anemia).  A blood disorder that makes bone marrow unable to produce enough new RBCs (aplastic anemia or bone marrow failure).  An enlarged spleen (hypersplenism). An enlarged spleen can trap blood cells and destroy them faster than they can be replaced.  Inherited diseases of the blood or bone marrow.  Cancers that affect bone marrow.  Certain medicines, such as: ? Chemotherapy. ? Medicines that reduce the activity of the immune system (immunosuppressantmedicines).  Exposure to radiation.  Severe infections.  What increases the risk? The following factors may make you more likely to develop this condition:  Being 35?60 years old.  Being a man.  Having a family history of a blood or bone marrow disease.  Having certain conditions, such as: ? Alcohol use disorder. ? HIV (human immunodeficiency virus) or AIDS (acquired immunodeficiency syndrome). ? Cancer. ? Conditions in which the bodys disease-fighting system attacks normal tissues (autoimmune diseases).  What are the signs or symptoms? Symptoms of this condition vary depending on the cause  and may include:  Anemia.  Weakness.  Shortness of breath.  Unusual bruising and bleeding.  Frequent infections.  Fatigue.  Fever.  Pale skin.  Bone pain.  Night sweats.  Weight loss.  Headache.  Dizziness.  Feeling unusually cold.  How is  this diagnosed? This condition may be diagnosed based on:  Your symptoms.  Your medical history.  A physical exam.  Removal of a sample of bone marrow to be examined under a microscope (biopsy). This is done by inserting a needle into a bone to remove fluid and cells (aspiration).  A complete blood count (CBC). This is a group of tests that measures characteristics of WBCs, RBCs, and platelets.  A peripheral blood smear. This test examines your blood under a microscope to provide information about drugs and diseases that affect RBCs, WBCs, and platelets.  Reticulocyte count. This is a test that measures the amount of new or immature RBCs (reticulocytes) that are made by your bone marrow.  Imaging studies of your spleen or liver, such as X-rays.  A test to measure your vitamin B12 level.  Tests for viruses.  How is this treated? Treatment for this condition depends on the cause. Treatment may include:  Immunosuppressant medicines.  Antibiotic medicine.  Vitamin B12. This may be given as a treatment for megaloblastic anemia.  Medicines that help the bone marrow make blood cells (bone marrow stimulating drugs).  A bone marrow transplant.  Receiving donated blood through an IV tube (blood transfusion).  A procedure to remove your spleen (splenectomy). This may be done as a treatment for hypersplenism.  Follow these instructions at home: Caring for Your Body   Wash your hands often with soap and water. If soap and water are not available, use hand sanitizer.  Brush your teeth twice a day, and floss at least once a day. It is recommended that you visit the dentist every six months.  Stay up to date on your vaccinations, including a yearly (annual) flu shot. Ask your health care provider which vaccines you should get, such as a vaccine against pneumonia. Medicines  Take over-the-counter and prescription medicines only as told by your health care provider.  If you were  prescribed an antibiotic medicine, take it as told by your health care provider. Do not stop taking the antibiotic even if you start to feel better. General instructions  Work with your health care provider to manage your condition and educate yourself about your condition.  Do not participate in contact sports or dangerous activities. Ask your health care provider what activities are safe for you.  Follow food safety recommendations as told by your health care provider.  During cold and flu season, avoid crowded places and avoid contact with people who are sick. Flu season is typically between the months of October and May.  Keep all follow-up visits as told by your health care provider. This is important. Contact a health care provider if:  You have a fever.  You bruise or bleed easily.  You are dizzy.  You feel unusually weak or tired. Get help right away if:  You have bleeding that does not stop.  You have wheezing or shortness of breath.  You have chest pain. These symptoms may represent a serious problem that is an emergency. Do not wait to see if the symptoms will go away. Get medical help right away. Call your local emergency services (911 in the [place] Do not drive yourself to the hospital. This information is not  intended to replace advice given to you by your health care provider. Make sure you discuss any questions you have with your health care provider. Document Released: 02/14/2015 Document Revised: 06/26/2015 Document Reviewed: 02/13/2014 Elsevier Interactive Patient Education  [employer].

## 2016-10-22 NOTE — Telephone Encounter (Signed)
Call received from inpatient wanting to schedule follow up post discharge for 7-10 days with Dr Julien Nordmann.  Above request will be forwarded to Dr Julien Nordmann due to no current openings.  Verified demographics with phone number for pt contact.

## 2016-10-22 NOTE — Progress Notes (Signed)
Pt stated will wait till gets home being d/c today

## 2016-10-22 NOTE — Progress Notes (Signed)
Chaplain visited with patient who is being discharged today.  Patient says he has been having church all day and is much better.  Patient excited to be going home.  Chaplain provided ministry of presence and well wishes also for patient's spouse whom he was concerned about.  Please place another consult should patient have a need.

## 2016-10-22 NOTE — Care Management Note (Signed)
Case Management Note  Patient Details  Name: Gregory Duncan MRN: [id number] Date of Birth: 01/18/1956  Subjective/Objective:   Anemia           Action/Plan: Patient lives at home with his spouse; has not seen his PCP in several years and is requesting to see Cammie Sickle FNP with the [facility]; apt made for Oct 12,2018 at 1 pm; has private insurance with [employer] with prescription drug coverage; pharmacy of choice is [facility];Emotional support given, patient stated " I have always been healthy until now." CM will continue to follow for DCP.  Expected Discharge Date:  10/22/16               Expected Discharge Plan:  Home/Self Care  Discharge planning Services  CM Consult, Follow-up appt scheduled  Choice offered to:  Patient  Status of Service:  In process, will continue to follow  Sherrilyn Rist [phone] 10/22/2016, 10:51 AM

## 2016-10-25 ENCOUNTER — Encounter: Payer: Self-pay | Admitting: Oncology

## 2016-10-25 ENCOUNTER — Telehealth: Payer: Self-pay | Admitting: Oncology

## 2016-10-25 NOTE — Telephone Encounter (Signed)
Hospital follow up appt has been scheduled for the pt to see Altamese Dilling on 10/4 at 930am. Pt agreed to the appt date and time. Letter mailed.

## 2016-10-28 ENCOUNTER — Ambulatory Visit (INDEPENDENT_AMBULATORY_CARE_PROVIDER_SITE_OTHER): Payer: BLUE CROSS/BLUE SHIELD | Admitting: Physician Assistant

## 2016-10-28 ENCOUNTER — Encounter: Payer: Self-pay | Admitting: Physician Assistant

## 2016-10-28 VITALS — BP 120/60 | HR 76 | Ht 67.0 in | Wt 176.0 lb

## 2016-10-28 DIAGNOSIS — R1314 Dysphagia, pharyngoesophageal phase: Secondary | ICD-10-CM

## 2016-10-28 DIAGNOSIS — K219 Gastro-esophageal reflux disease without esophagitis: Secondary | ICD-10-CM | POA: Diagnosis not present

## 2016-10-28 DIAGNOSIS — D649 Anemia, unspecified: Secondary | ICD-10-CM

## 2016-10-28 DIAGNOSIS — R634 Abnormal weight loss: Secondary | ICD-10-CM

## 2016-10-28 MED ORDER — SUCRALFATE 1 GM/10ML PO SUSP: 1.0000 g | ORAL | 1 refills | Status: DC | PRN

## 2016-10-28 MED ORDER — NA SULFATE-K SULFATE-MG SULF 17.5-3.13-1.6 GM/177ML PO SOLN: 1.0000 | ORAL | 0 refills | Status: DC

## 2016-10-28 NOTE — Progress Notes (Addendum)
Chief Complaint: Anemia, weight loss, GERD, dysphagia  HPI:  Gregory Duncan is a 61 year old Caucasian male with a past medical history as listed below who presents to clinic today as a referral from hospitalist Dr. Alcario Drought after recent hospital visit, for anemia, weight loss, reflux and dysphagia.    Patient was recently admitted to the hospital 10/19/16-10/22/16 for a complaint of symptomatic anemia. Patient was seen by hematology at that time and found to have severe vitamin B12 deficiency. It was also recommended that due to his reflux symptoms and lack of previous colonoscopy that he had GI workup in the future. Most recent CBC on 10/22/16 continued to show a pancytopenia with a hemoglobin of 10.2. This was improved from time of admission.    Today, the patient presents to clinic and tells me that he was diagnosed with reflux back in the 90s via endoscopy. At that time, he was on some medicine but changed jobs and has lost his insurance and has not been on medicine since. He tells me that he will have "flareups" of his reflux occasionally, typically if he eats too much food at once. Patient tells me this may occur once a week or not occur for a month at all. 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 He tells me that "it feels like a have to pass gas". Patient tells me that he will walk around and press his stomach and as soon as he passes the gas, the pressure and burning are released. Patient tells me he used to use Carafate when necessary instead of Pepto-Bismol which seemed to work better for these symptoms. Associated symptoms include a feeling of dysphagia typically during these "flareups". Patient has also lost a total of about 40 pounds in the past couple of months without trying.   Patient denies previous colonoscopy.   Patient denies fever, chills, blood in  the stool, melena, change in bowel habits, anorexia, nausea, vomiting or symptoms that awaken him at night.  Past Medical History:  Diagnosis Date  . Anemia   . GERD (gastroesophageal reflux disease)   . Hiatal hernia   . Vitamin B12 deficiency     Past Surgical History:  Procedure Laterality Date  . eye cyst removed Left    as a child  . MOUTH SURGERY      Current Outpatient Prescriptions  Medication Sig Dispense Refill  . bismuth subsalicylate (PEPTO BISMOL) 262 MG/15ML suspension Take 30 mLs by mouth every 6 (six) hours as needed for indigestion.    . calcium carbonate (TUMS - DOSED IN MG ELEMENTAL CALCIUM) 500 MG chewable tablet Chew 1 tablet by mouth daily as needed for indigestion or heartburn.    . cyanocobalamin (,VITAMIN B-12,) 1000 MCG/ML injection Use daily injections for 4 days, then once per week for 4 weeks. 8 mL 0  . folic acid (FOLVITE) 518 MCG tablet Take 1 tablet (400 mcg total) by mouth daily. 30 tablet 0  . Insulin Syringe-Needle U-100 (RELI-ON INS SYR 1CC/30G) 30G 1 ML MISC Use with B12 injections. 8 each 0  . Syringe, Disposable, 1 ML MISC Use with B12 injections 8 each 0   No current facility-administered medications for this visit.     Allergies as of 10/28/2016  . (No Known Allergies)    Family History  Problem Relation Age of Onset  . Heart  attack Father        died at 62  . Colon cancer Neg Hx     Social History   Social History  . Marital status: Married    Spouse name: N/A  . Number of children: 0  . Years of education: N/A   Occupational History  . [employer]    Social History Main Topics  . Smoking status: Never Smoker  . Smokeless tobacco: Never Used  . Alcohol use No  . Drug use: No  . Sexual activity: Not on file   Other Topics Concern  . Not on file   Social History Narrative  . No narrative on file    Review of Systems:    Constitutional: No weight loss, fever or chills Skin: No rash  Cardiovascular: No chest  pain Respiratory: No SOB Gastrointestinal: See HPI and otherwise negative Genitourinary: No dysuria Neurological: No headache Musculoskeletal: No new muscle or joint pain Hematologic: No bleeding  Psychiatric: No history of depression or anxiety   Physical Exam:  Vital signs: BP 120/60   Pulse 76   Ht 5\' 7"  (1.702 m)   Wt 176 lb (79.8 kg)   BMI 27.57 kg/m   Constitutional:   Pleasant Caucasian male appears to be in NAD, Well developed, Well nourished, alert and cooperative Head:  Normocephalic and atraumatic. Eyes:   PEERL, EOMI. No icterus. Conjunctiva pink. Ears:  Normal auditory acuity. Neck:  Supple Throat: Oral cavity and pharynx without inflammation, swelling or lesion.  Respiratory: Respirations even and unlabored. Lungs clear to auscultation bilaterally.   No wheezes, crackles, or rhonchi.  Cardiovascular: Normal S1, S2. No MRG. Regular rate and rhythm. No peripheral edema, cyanosis or pallor.  Gastrointestinal:  Soft, nondistended, nontender. No rebound or guarding. Normal bowel sounds. No appreciable masses or hepatomegaly. Rectal:  Not performed.  Msk:  Symmetrical without gross deformities. Without edema, no deformity or joint abnormality.  Neurologic:  Alert and  oriented x4;  grossly normal neurologically.  Skin:   Dry and intact without significant lesions or rashes. Psychiatric: Demonstrates good judgement and reason without abnormal affect or behaviors.  MOST RECENT LABS AND IMAGING: CBC    Component Value Date/Time   WBC 2.5 (L) 10/22/2016 0410   RBC 3.05 (L) 10/22/2016 0410   HGB 10.2 (L) 10/22/2016 0410   HCT 28.8 (L) 10/22/2016 0410   PLT 21 (LL) 10/22/2016 0410   MCV 94.4 10/22/2016 0410   MCH 33.4 10/22/2016 0410   MCHC 35.4 10/22/2016 0410   RDW 20.6 (H) 10/22/2016 0410   LYMPHSABS 0.9 10/21/2016 1202   MONOABS 0.1 10/21/2016 1202   EOSABS 0.0 10/21/2016 1202   BASOSABS 0.0 10/21/2016 1202    CMP     Component Value Date/Time   NA 137  10/22/2016 0410   K 3.5 10/22/2016 0410   CL 105 10/22/2016 0410   CO2 24 10/22/2016 0410   GLUCOSE 130 (H) 10/22/2016 0410   BUN 8 10/22/2016 0410   CREATININE 0.70 10/22/2016 0410   CALCIUM 8.2 (L) 10/22/2016 0410   GFRNONAA >60 10/22/2016 0410   GFRAA >60 10/22/2016 0410    EXAM: CT CHEST, ABDOMEN, AND PELVIS WITH CONTRAST 10/21/16  TECHNIQUE: Multidetector CT imaging of the chest, abdomen and pelvis was performed following the standard protocol during bolus administration of intravenous contrast.  CONTRAST:  125mL ISOVUE-300 IOPAMIDOL (ISOVUE-300) INJECTION 61%  COMPARISON:  None.  FINDINGS: CT CHEST FINDINGS  Cardiovascular: Atherosclerosis of thoracic aorta is noted without aneurysm or dissection. Coronary  artery calcifications are noted. No pericardial effusion is noted.  Mediastinum/Nodes: No enlarged mediastinal, hilar, or axillary lymph nodes. Thyroid gland, trachea, and esophagus demonstrate no significant findings.  Lungs/Pleura: Lungs are clear. No pleural effusion or pneumothorax.  Musculoskeletal: No chest wall mass or suspicious bone lesions identified.  CT ABDOMEN PELVIS FINDINGS  Hepatobiliary: Solitary gallstone is noted. The liver is unremarkable.  Pancreas: Unremarkable. No pancreatic ductal dilatation or surrounding inflammatory changes.  Spleen: Normal in size without focal abnormality.  Adrenals/Urinary Tract: Adrenal glands are unremarkable. Kidneys are normal, without renal calculi, focal lesion, or hydronephrosis. Bladder is unremarkable.  Stomach/Bowel: Stomach is within normal limits. Appendix appears normal. No evidence of bowel wall thickening, distention, or inflammatory changes. Sigmoid diverticulosis is noted without inflammation.  Vascular/Lymphatic: Aortic atherosclerosis. No enlarged abdominal or pelvic lymph nodes.  Reproductive: Prostate is unremarkable.  Other: No abdominal wall hernia or  abnormality. No abdominopelvic ascites.  Musculoskeletal: No acute or significant osseous findings.  IMPRESSION: Aortic atherosclerosis.  Coronary artery calcifications are noted consistent coronary artery disease.  Solitary gallstone is noted.  Sigmoid diverticulosis without inflammation.  No other significant abnormality seen in the abdomen or pelvis.   Electronically Signed   By: Marijo Conception, M.D.   On: 10/21/2016 21:00  Assessment: 1. Anemia: Symptomatic upon admission to the hospital recently, vitamin B-12 deficiency severe, thought to be the cause from hematology though they also requested a GI workup due to reflux, dysphagia and weight loss; consider most likely vitamin B12 deficiency plus/minus upper versus lower GI source of blood loss 2. GERD: For the past 20+ years, currently episodic, controlled with Pepto-Bismol 3. Dysphagia: With episodes above; consider GERD versus esophagitis or stricture versus ring versus web versus mass 4. Weight loss: 40+ pounds over the past few months, patient tells me this is due to a decrease in appetite over that time  Plan: 1. Scheduled patient for an EGD and colonoscopy in the [place] with Dr. Hilarie Fredrickson. Did discuss risks, benefits, limitations and alternatives and the patient agrees to proceed. 2. Patient should keep follow-up as scheduled with hematology next week. 3. Prescribed Carafate per patient request for breakthrough reflux symptoms 4. Patient to follow in clinic per recommendations from Dr. Hilarie Fredrickson after time of procedures.  Ellouise Newer, PA-C [facility] 10/28/2016, 10:21 AM  Cc: No ref. provider found   Addendum: Reviewed and agree with initial management. Pyrtle, Lajuan Lines, MD

## 2016-10-28 NOTE — Patient Instructions (Addendum)
You have been scheduled for an endoscopy and colonoscopy. Please follow the written instructions given to you at your visit today. Please pick up your prep supplies at the pharmacy within the next 1-3 days. If you use inhalers (even only as needed), please bring them with you on the day of your procedure. Your physician has requested that you go to www.startemmi.com and enter the access code given to you at your visit today. This web site gives a general overview about your procedure. However, you should still follow specific instructions given to you by our office regarding your preparation for the procedure.  We have sent the following medications to your pharmacy for you to pick up at your convenience: Carafate liquid suspension as needed

## 2016-10-29 LAB — TROPONIN I: Troponin I: 0.03 ng/mL (ref <0.03)

## 2016-11-01 ENCOUNTER — Telehealth: Payer: Self-pay | Admitting: Internal Medicine

## 2016-11-01 MED ORDER — SUCRALFATE 1 G PO TABS: 1.0000 g | ORAL_TABLET | Freq: Three times a day (TID) | ORAL | 3 refills | Status: DC

## 2016-11-01 NOTE — Telephone Encounter (Signed)
Spoke to patient and left him a sample of Suprep upfront to pickup. Pt verbalized understanding. Also pt states Carafate suspension was too expensive and would like tablets called in. Rx sent to [facility] on [address]. Pt understands that he should dissolve them in water and make a slurry.

## 2016-11-04 ENCOUNTER — Encounter: Payer: Self-pay | Admitting: Oncology

## 2016-11-04 ENCOUNTER — Other Ambulatory Visit: Payer: Self-pay | Admitting: *Deleted

## 2016-11-04 ENCOUNTER — Other Ambulatory Visit (HOSPITAL_BASED_OUTPATIENT_CLINIC_OR_DEPARTMENT_OTHER): Payer: BLUE CROSS/BLUE SHIELD

## 2016-11-04 ENCOUNTER — Ambulatory Visit (HOSPITAL_BASED_OUTPATIENT_CLINIC_OR_DEPARTMENT_OTHER): Payer: BLUE CROSS/BLUE SHIELD | Admitting: Oncology

## 2016-11-04 ENCOUNTER — Telehealth: Payer: Self-pay | Admitting: Oncology

## 2016-11-04 VITALS — BP 110/64 | HR 69 | Temp 98.6°F | Resp 18 | Ht 67.0 in | Wt 175.8 lb

## 2016-11-04 DIAGNOSIS — E538 Deficiency of other specified B group vitamins: Secondary | ICD-10-CM | POA: Diagnosis not present

## 2016-11-04 DIAGNOSIS — D61818 Other pancytopenia: Secondary | ICD-10-CM

## 2016-11-04 DIAGNOSIS — D649 Anemia, unspecified: Secondary | ICD-10-CM

## 2016-11-04 LAB — CBC WITH DIFFERENTIAL/PLATELET
BASO%: 0.5 % (ref 0.0–2.0)
BASOS ABS: 0 10*3/uL (ref 0.0–0.1)
EOS ABS: 0.4 10*3/uL (ref 0.0–0.5)
EOS%: 4.9 % (ref 0.0–7.0)
HCT: 28.6 % — ABNORMAL LOW (ref 38.4–49.9)
HGB: 9 g/dL — ABNORMAL LOW (ref 13.0–17.1)
LYMPH%: 17 % (ref 14.0–49.0)
MCH: 30.5 pg (ref 27.2–33.4)
MCHC: 31.5 g/dL — AB (ref 32.0–36.0)
MCV: 96.9 fL (ref 79.3–98.0)
MONO#: 0.9 10*3/uL (ref 0.1–0.9)
MONO%: 11.2 % (ref 0.0–14.0)
NEUT%: 66.4 % (ref 39.0–75.0)
NEUTROS ABS: 5.1 10*3/uL (ref 1.5–6.5)
PLATELETS: 442 10*3/uL — AB (ref 140–400)
RBC: 2.95 10*6/uL — AB (ref 4.20–5.82)
RDW: 17.8 % — ABNORMAL HIGH (ref 11.0–14.6)
WBC: 7.7 10*3/uL (ref 4.0–10.3)
lymph#: 1.3 10*3/uL (ref 0.9–3.3)

## 2016-11-04 LAB — COMPREHENSIVE METABOLIC PANEL
ALT: 13 U/L (ref 0–55)
ANION GAP: 8 meq/L (ref 3–11)
AST: 17 U/L (ref 5–34)
Albumin: 3.2 g/dL — ABNORMAL LOW (ref 3.5–5.0)
Alkaline Phosphatase: 89 U/L (ref 40–150)
BILIRUBIN TOTAL: 1.16 mg/dL (ref 0.20–1.20)
BUN: 8.2 mg/dL (ref 7.0–26.0)
CO2: 23 meq/L (ref 22–29)
Calcium: 8.9 mg/dL (ref 8.4–10.4)
Chloride: 108 mEq/L (ref 98–109)
Creatinine: 0.8 mg/dL (ref 0.7–1.3)
Glucose: 111 mg/dl (ref 70–140)
POTASSIUM: 4.4 meq/L (ref 3.5–5.1)
SODIUM: 140 meq/L (ref 136–145)
TOTAL PROTEIN: 6.6 g/dL (ref 6.4–8.3)

## 2016-11-04 NOTE — Progress Notes (Signed)
[facility] Follow up:    Patient, No Pcp Per No address on file   DIAGNOSIS: Pancytopenia likely secondary to severe vitamin B12 deficiency. Patient presented with a vitamin B12 level of less than 50.  SUMMARY OF ONCOLOGIC HISTORY:  No history exists.    CURRENT THERAPY: Vitamin B12 1000 g weekly  INTERVAL HISTORY: Gregory Duncan 61 y.o. male returns for follow-up post-hospitalization. Patient reports that he is feeling better. His fatigue and weakness have improved and he is back to work full time. The patient denies fevers and chills. Denies chest pain, shortness of breath, cough, hemoptysis. Denies nausea, vomiting, diarrhea, constipation. The patient continues to have heartburn and takes over-the-counter herbal medications as well as Carafate. The patient has been seen by GI and is scheduled to undergo an upper endoscopy and colonoscopy later this month. His appetite has improved and he is gaining back his lost weight. The patient is here for evaluation and discussion of lab results.   Patient Active Problem List   Diagnosis Date Noted  . Symptomatic anemia 10/20/2016  . Pancytopenia ([place]) 10/20/2016    has No Known Allergies.  MEDICAL HISTORY: Past Medical History:  Diagnosis Date  . Anemia   . GERD (gastroesophageal reflux disease)   . Hiatal hernia   . Vitamin B12 deficiency     SURGICAL HISTORY: Past Surgical History:  Procedure Laterality Date  . eye cyst removed Left    as a child  . MOUTH SURGERY      SOCIAL HISTORY: Social History   Social History  . Marital status: Married    Spouse name: N/A  . Number of children: 0  . Years of education: N/A   Occupational History  . [employer]    Social History Main Topics  . Smoking status: Never Smoker  . Smokeless tobacco: Never Used  . Alcohol use No  . Drug use: No  . Sexual activity: Not on file   Other Topics Concern  . Not on file   Social History Narrative  . No  narrative on file    FAMILY HISTORY: Family History  Problem Relation Age of Onset  . Heart attack Father        died at 11  . Colon cancer Neg Hx     Review of Systems  Constitutional: Negative.   HENT:  Negative.   Eyes: Negative.   Respiratory: Negative.   Cardiovascular: Negative.   Gastrointestinal: Negative for blood in stool, constipation, diarrhea, nausea and vomiting.       Positive for heartburn.  Genitourinary: Negative.    Musculoskeletal: Negative.   Skin: Negative.   Neurological: Negative.   Hematological: Negative.   Psychiatric/Behavioral: Negative.       PHYSICAL EXAMINATION  ECOG PERFORMANCE STATUS: 1 - Symptomatic but completely ambulatory  Vitals:   11/04/16 0933  BP: 110/64  Pulse: 69  Resp: 18  Temp: 98.6 F (37 C)  SpO2: 100%    Physical Exam  Constitutional: He is oriented to person, place, and time and well-developed, well-nourished, and in no distress. No distress.  HENT:  Head: Normocephalic.  Mouth/Throat: Oropharynx is clear and moist. No oropharyngeal exudate.  Eyes: Conjunctivae are normal. Right eye exhibits no discharge. Left eye exhibits no discharge. No scleral icterus.  Neck: Normal range of motion. Neck supple.  Cardiovascular: Normal rate, regular rhythm, normal heart sounds and intact distal pulses.   Pulmonary/Chest: Effort normal and breath sounds normal. No respiratory distress. He has no  wheezes. He has no rales.  Abdominal: Soft. Bowel sounds are normal. He exhibits no distension and no mass. There is no tenderness.  Musculoskeletal: Normal range of motion. He exhibits no edema.  Lymphadenopathy:    He has no cervical adenopathy.  Neurological: He is alert and oriented to person, place, and time. He exhibits normal muscle tone. Gait normal. Coordination normal.  Skin: Skin is warm and dry. No rash noted. He is not diaphoretic. No erythema. No pallor.  Psychiatric: Mood, memory, affect and judgment normal.  Vitals  reviewed.   LABORATORY DATA:  CBC    Component Value Date/Time   WBC 7.7 11/04/2016 0909   WBC 2.5 (L) 10/22/2016 0410   RBC 2.95 (L) 11/04/2016 0909   RBC 3.05 (L) 10/22/2016 0410   HGB 9.0 (L) 11/04/2016 0909   HCT 28.6 (L) 11/04/2016 0909   PLT 442 (H) 11/04/2016 0909   MCV 96.9 11/04/2016 0909   MCH 30.5 11/04/2016 0909   MCH 33.4 10/22/2016 0410   MCHC 31.5 (L) 11/04/2016 0909   MCHC 35.4 10/22/2016 0410   RDW 17.8 (H) 11/04/2016 0909   LYMPHSABS 1.3 11/04/2016 0909   MONOABS 0.9 11/04/2016 0909   EOSABS 0.4 11/04/2016 0909   BASOSABS 0.0 11/04/2016 0909    CMP     Component Value Date/Time   NA 140 11/04/2016 0909   K 4.4 11/04/2016 0909   CL 105 10/22/2016 0410   CO2 23 11/04/2016 0909   GLUCOSE 111 11/04/2016 0909   BUN 8.2 11/04/2016 0909   CREATININE 0.8 11/04/2016 0909   CALCIUM 8.9 11/04/2016 0909   PROT 6.6 11/04/2016 0909   ALBUMIN 3.2 (L) 11/04/2016 0909   AST 17 11/04/2016 0909   ALT 13 11/04/2016 0909   ALKPHOS 89 11/04/2016 0909   BILITOT 1.16 11/04/2016 0909   GFRNONAA >60 10/22/2016 0410   GFRAA >60 10/22/2016 0410    RADIOGRAPHIC STUDIES:  No results found.  ASSESSMENT and THERAPY PLAN:   Pancytopenia ([place]) This is a very pleasant 61 year old white male presented with severe pancytopenia. This is likely secondary to severe vitamin B 12 deficiency.   The patient was seen with Dr. Julien Nordmann. The patient is currently taking Vitamin B 12 1000 g intramuscular. He initially took this daily, but started his weekly B-12 injections this past Monday. Discussed lab results from today which show his white count is now normal and his platelets are mildly elevated. Hemoglobin remains low at 9.0. Recommend that he continue weekly vitamin B12 for a total of 4 weeks. He will then switch to monthly vitamin B12. Also recommend that he continue folic acid and that he should begin ferrous sulfate 325 mg 2-3 times per day. He was encouraged to take his ferrous  sulfate with vitamin C such as orange juice.  The patient will keep his follow-up with gastroenterology to rule out any gastric abnormality especially with his history of persistent GERD.  The patient will follow-up in this office in approximately one month to recheck his labs.   Orders Placed This Encounter  Procedures  . CBC with Differential/Platelet    Standing Status:   Future    Standing Expiration Date:   11/04/2017  . Comprehensive metabolic panel    Standing Status:   Future    Standing Expiration Date:   11/04/2017    All questions were answered. The patient knows to call the clinic with any problems, questions or concerns. We can certainly see the patient much sooner if  necessary.  Gregory Bussing, NP 11/04/2016   ADDENDUM: Hematology/Oncology Attending: I had a face to face encounter with the patient today. I recommended his care plan. This is a very pleasant 61 years old white male who is here for hospital follow-up visit. The patient was seen for initial consultation during his hospitalization when he was found to have significant pancytopenia. Lab work at that time showed severe vitamin B 12 deficiency. I recommended for the patient to start treatment with daily vitamin B 12 injection for 1 week followed by weekly injection for 4 weeks followed by monthly injection. He has repeat CBC today that showed significant improvement in his condition with normal white blood count and elevated platelets count. His hemoglobin is better but still low. I recommended for the patient to continue with the vitamin B 12 injection as prescribed. We'll also consider starting the patient on oral iron tablets. He is scheduled to see a gastroenterologist for consideration of upper endoscopy in the next few weeks. I would see the patient back for follow-up visit in one month's for reevaluation and more recommendation regarding his condition depending on his lab results. The patient was advised to call  immediately if he has any concerning symptoms in the interval.  Disclaimer: This note was dictated with voice recognition software. Similar sounding words can inadvertently be transcribed and may be missed upon review. Gregory Kempf, MD 11/06/16

## 2016-11-04 NOTE — Telephone Encounter (Signed)
Gave avs and calendar for October  °

## 2016-11-04 NOTE — Assessment & Plan Note (Signed)
This is a very pleasant 61 year old white male presented with severe pancytopenia. This is likely secondary to severe vitamin B 12 deficiency.   The patient was seen with Dr. Julien Nordmann. The patient is currently taking Vitamin B 12 1000 g intramuscular. He initially took this daily, but started his weekly B-12 injections this past Monday. Discussed lab results from today which show his white count is now normal and his platelets are mildly elevated. Hemoglobin remains low at 9.0. Recommend that he continue weekly vitamin B12 for a total of 4 weeks. He will then switch to monthly vitamin B12. Also recommend that he continue folic acid and that he should begin ferrous sulfate 325 mg 2-3 times per day. He was encouraged to take his ferrous sulfate with vitamin C such as orange juice.  The patient will keep his follow-up with gastroenterology to rule out any gastric abnormality especially with his history of persistent GERD.  The patient will follow-up in this office in approximately one month to recheck his labs.

## 2016-11-05 ENCOUNTER — Telehealth: Payer: Self-pay | Admitting: *Deleted

## 2016-11-05 NOTE — Telephone Encounter (Signed)
Returned call to pt unable to reach. lmovm for pt with detailed instructions for pt to continue Vitamin b12 x 4 weeks and then switch to monthly. Continue Folic acid and begin ferrous sulfate 325 mg 2-3 times per day. He was encouraged to take his ferrous sulfate with vitamin C such as orange juice. Requested pt call back with additional concerns.

## 2016-11-08 ENCOUNTER — Telehealth: Payer: Self-pay

## 2016-11-08 MED ORDER — FERROUS SULFATE 325 (65 FE) MG PO TBEC: 325.0000 mg | DELAYED_RELEASE_TABLET | Freq: Three times a day (TID) | ORAL | 0 refills | Status: DC

## 2016-11-08 NOTE — Telephone Encounter (Signed)
Pt was having difficulty finding iron OTC. He is asking for assistance. Ferrous sulfate ordered per Kristin's OV note 10/4. That way pharmacy can help him.

## 2016-11-12 ENCOUNTER — Ambulatory Visit (INDEPENDENT_AMBULATORY_CARE_PROVIDER_SITE_OTHER): Payer: BLUE CROSS/BLUE SHIELD | Admitting: Family Medicine

## 2016-11-12 ENCOUNTER — Encounter: Payer: Self-pay | Admitting: Family Medicine

## 2016-11-12 VITALS — BP 148/90 | HR 67 | Temp 98.4°F | Resp 16 | Ht 67.0 in | Wt 175.0 lb

## 2016-11-12 DIAGNOSIS — Z1159 Encounter for screening for other viral diseases: Secondary | ICD-10-CM | POA: Diagnosis not present

## 2016-11-12 DIAGNOSIS — E538 Deficiency of other specified B group vitamins: Secondary | ICD-10-CM | POA: Insufficient documentation

## 2016-11-12 DIAGNOSIS — K219 Gastro-esophageal reflux disease without esophagitis: Secondary | ICD-10-CM | POA: Diagnosis not present

## 2016-11-12 DIAGNOSIS — Z23 Encounter for immunization: Secondary | ICD-10-CM

## 2016-11-12 DIAGNOSIS — Z131 Encounter for screening for diabetes mellitus: Secondary | ICD-10-CM

## 2016-11-12 DIAGNOSIS — Z13220 Encounter for screening for lipoid disorders: Secondary | ICD-10-CM

## 2016-11-12 LAB — POCT GLYCOSYLATED HEMOGLOBIN (HGB A1C): Hemoglobin A1C: 4.6

## 2016-11-12 NOTE — Progress Notes (Signed)
Subjective:    Patient ID: Gregory Duncan, male    DOB: [birth year], 61 y.o.   MRN: [id number]  HPI  Gregory Duncan, a 61 year old male presents to establish care. Gregory Duncan was a patient at [facility] but was lost to follow up due to insurance constraints. Patient was recently admitted to inpatient services on 9/18 after presenting to the emergency department with worsening weakness. 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 He was found to have pancytopenia and oncology was consulted.  Patient was also found to have a B12 deficiency. Patient was discharged home on weekly B12 injections  Gregory Duncan also has a history of GERD. This has been associated with belching and eructation, heartburn and midespigastric pain.  He denies difficulty swallowing, dysphagia, early satiety, fullness after meals and hoarseness. Symptoms have improved since starting Carafate. Patient is followed by Zenovia Jarred at [facility] and  has an upper GI scheduled . He denies dysphagia.  He has not lost weight. He denies melena, hematochezia, hematemesis, and coffee ground emesis.   Patient feels well and is without complaint  Past Medical History:  Diagnosis Date  . Anemia   . GERD (gastroesophageal reflux disease)   . Hiatal hernia   . Vitamin B12 deficiency    Social History   Social History  . Marital status: Married    Spouse name: N/A  . Number of children: 0  . Years of education: N/A   Occupational History  . [employer]    Social History Main Topics  . Smoking status: Never Smoker  . Smokeless tobacco: Never Used  . Alcohol use No  . Drug use: No  . Sexual activity: Not on file   Other Topics Concern  . Not on file   Social History Narrative  . No narrative on file    There is no immunization history on file for this patient.  Review of Systems  Constitutional: Negative for fatigue, fever and unexpected weight change.  HENT:  Negative.   Respiratory: Negative.   Gastrointestinal: Negative.   Endocrine: Negative for polydipsia, polyphagia and polyuria.  Genitourinary: Negative.   Musculoskeletal: Negative.   Skin: Negative.   Allergic/Immunologic: Negative for immunocompromised state.  Neurological: Negative.  Negative for dizziness, seizures and facial asymmetry.  Hematological: Negative.  Negative for adenopathy.  Psychiatric/Behavioral: Negative.        Objective:   Physical Exam  Constitutional: He is oriented to person, place, and time.  HENT:  Head: Normocephalic and atraumatic.  Right Ear: External ear normal.  Left Ear: External ear normal.  Nose: Nose normal.  Mouth/Throat: Oropharynx is clear and moist.  Eyes: Pupils are equal, round, and reactive to light. Conjunctivae and EOM are normal.  Neck: Normal range of motion. Neck supple.  Cardiovascular: Normal rate, regular rhythm, normal heart sounds and intact distal pulses.   Pulmonary/Chest: Effort normal and breath sounds normal.  Abdominal: Soft. Bowel sounds are normal.  Musculoskeletal: Normal range of motion.  Neurological: He is alert and oriented to person, place, and time. He has normal reflexes.  Skin: Skin is warm and dry.  Psychiatric: He has a normal mood and affect. His behavior is normal. Judgment and thought content normal.     BP (!) 148/90 (BP Location: Left Arm, Patient Position: Sitting) Comment: manually  Pulse 67   Temp 98.4 F (36.9 C) (Oral)   Resp 16   Ht 5\' 7"  (1.702 m)  Wt 175 lb (79.4 kg)   SpO2 99%   BMI 27.41 kg/m  Assessment & Plan:  1. B12 deficiency Continue weekly B12 injections per oncology.   2. Gastroesophageal reflux disease without esophagitis Follow up with gastroenterology as scheduled for upper GI and colonoscopy  3. Influenza vaccination given - Flu Vaccine QUAD 6+ mos PF IM (Fluarix Quad PF)  4. Need for Tdap vaccination - Tdap vaccine greater than or equal to 7yo IM  5. Need for  hepatitis C screening test - Hepatitis C Antibody  6. Screening cholesterol level - Lipid Panel; Future  7. Diabetes mellitus screening - HgB A1c  Health maintenance: Recommend the following:  Yearly eye exam Yearly dental exam Colonoscopy Cholesterol screening  Prostate exam  RTC; Will follow up in 3 months for a complete physical exam    Donia Pounds  MSN, FNP-C Patient [facility] [address] [place], [place] [address] [phone]

## 2016-11-12 NOTE — Patient Instructions (Signed)
Thank you for establishing care.  Recommend a lowfat, low carbohydrate diet divided over 5-6 small meals, increase water intake to 6-8 glasses, and 150 minutes per week of cardiovascular exercise.   Will check fasting cholesterol in 1 week   Continue to follow up    Vitamin B12 Deficiency Vitamin B12 deficiency occurs when the body does not have enough vitamin B12. Vitamin B12 is an important vitamin. The body needs vitamin B12:  To make red blood cells.  To make DNA. This is the genetic material inside cells.  To help the nerves work properly so they can carry messages from the brain to the body.  Vitamin B12 deficiency can cause various health problems, such as a low red blood cell count (anemia) or nerve damage. What are the causes? This condition may be caused by:  Not eating enough foods that contain vitamin B12.  Not having enough stomach acid and digestive fluids to properly absorb vitamin B12 from the food that you eat.  Certain digestive system diseases that make it hard to absorb vitamin B12. These diseases include Crohn disease, chronic pancreatitis, and cystic fibrosis.  Pernicious anemia. This is a condition in which the body does not make enough of a protein (intrinsic factor), resulting in too few red blood cells.  Having a surgery in which part of the stomach or small intestine is removed.  Taking certain medicines that make it hard for the body to absorb vitamin B12. These medicines include: ? Heartburn medicine (antacids and proton pump inhibitors). ? An antibiotic medicine called neomycin. ? Some medicines that are used to treat diabetes, tuberculosis, gout, or high cholesterol.  What increases the risk? The following factors may make you more likely to develop a B12 deficiency:  Being older than age 71.  Eating a vegetarian or vegan diet, especially while you are pregnant.  Eating a poor diet while you are pregnant.  Taking certain drugs.  Having  alcoholism.  What are the signs or symptoms? In some cases, there are no symptoms of this condition. If the condition leads to anemia or nerve damage, various symptoms can occur, such as:  Weakness.  Fatigue.  Loss of appetite.  Weight loss.  Numbness or tingling in your hands and feet.  Redness and burning of the tongue.  Confusion or memory problems.  Depression.  Sensory problems, such as color blindness, ringing in the ears, or loss of taste.  Diarrhea or constipation.  Trouble walking.  If anemia is severe, symptoms can include:  Shortness of breath.  Dizziness.  Rapid heart rate (tachycardia).  How is this diagnosed? This condition may be diagnosed with a blood test to measure the level of vitamin B12 in your blood. You may have other tests to help find the cause of your vitamin B12 deficiency. These tests may include:  A complete blood count (CBC). This is a group of tests that measure certain characteristics of blood cells.  A blood test to measure intrinsic factor.  An endoscopy. In this procedure, a thin tube with a camera on the end is used to look into your stomach or intestines.  How is this treated? Treatment for this condition depends on the cause. Common treatment options include:  Changing your eating and drinking habits, such as: ? Eating more foods that contain vitamin B12. ? Drinking less alcohol or no alcohol.  Taking vitamin B12 supplements. Your health care provider will tell you which dosage is best for you.  Getting vitamin B12 injections.  Follow these instructions at home:  Take supplements only as told by your health care provider. Follow the directions carefully.  Get any injections that are prescribed by your health care provider.  Do not miss your appointments.  Eat lots of healthy foods that contain vitamin B12. Ask your health care provider if you should work with a dietitian. Foods that contain vitamin B12  include: ? Meat. ? Meat from birds (poultry). ? Fish. ? Eggs. ? Cereal and dairy products that are fortified. This means that vitamin B12 has been added to the food. Check the label on the package to see if the food is fortified.  Do not abuse alcohol.  Keep all follow-up visits as told by your health care provider. This is important. Contact a health care provider if:  Your symptoms come back. Get help right away if:  You develop shortness of breath.  You have chest pain.  You become dizzy or you lose consciousness. This information is not intended to replace advice given to you by your health care provider. Make sure you discuss any questions you have with your health care provider. Document Released: 04/12/2011 Document Revised: 07/02/2015 Document Reviewed: 06/05/2014 Elsevier Interactive Patient Education  2018 [employer].

## 2016-11-13 LAB — HEPATITIS C ANTIBODY
Hepatitis C Ab: NONREACTIVE
SIGNAL TO CUT-OFF: 0.02 (ref <1.00)

## 2016-11-18 ENCOUNTER — Encounter: Payer: Self-pay | Admitting: Internal Medicine

## 2016-11-19 ENCOUNTER — Other Ambulatory Visit: Payer: BLUE CROSS/BLUE SHIELD

## 2016-11-19 DIAGNOSIS — Z13220 Encounter for screening for lipoid disorders: Secondary | ICD-10-CM

## 2016-11-19 LAB — LIPID PANEL
CHOL/HDL RATIO: 4 (calc) (ref <5.0)
CHOLESTEROL: 216 mg/dL — AB (ref <200)
HDL: 54 mg/dL (ref >40)
LDL CHOLESTEROL (CALC): 146 mg/dL — AB
Non-HDL Cholesterol (Calc): 162 mg/dL (calc) — ABNORMAL HIGH (ref <130)
Triglycerides: 63 mg/dL (ref <150)

## 2016-11-22 ENCOUNTER — Other Ambulatory Visit: Payer: Self-pay | Admitting: Family Medicine

## 2016-11-22 DIAGNOSIS — E785 Hyperlipidemia, unspecified: Secondary | ICD-10-CM

## 2016-11-22 MED ORDER — ATORVASTATIN CALCIUM 20 MG PO TABS: 20.0000 mg | ORAL_TABLET | Freq: Every day | ORAL | 3 refills | Status: DC

## 2016-11-22 NOTE — Progress Notes (Signed)
Reviewed labs. Cholesterol elevated.  The 10-year ASCVD risk score Mikey Bussing DC Brooke Bonito., et al., 2013) is: 11.2%   Values used to calculate the score:     Age: 61 years     Sex: Male     Is Non-Hispanic African American: No     Diabetic: No     Tobacco smoker: No     Systolic Blood Pressure: 010 mmHg     Is BP treated: No     HDL Cholesterol: 54 mg/dL     Total Cholesterol: 216 mg/dL   Total cholesterol 216, goal is <200    Meds ordered this encounter  Medications  . atorvastatin (LIPITOR) 20 MG tablet    Sig: Take 1 tablet (20 mg total) by mouth daily.    Dispense:  90 tablet    Refill:  [place]  MSN, FNP-C Patient [facility] [address] [place], [place] [address] [phone]

## 2016-11-22 NOTE — Progress Notes (Signed)
Called and spoke with patient, advised of elevated cholesterol and the need to start atorvastatin 20mg  once daily with dinner. Advised to eat a lowfat/low cholesterol diet over 5 to 6 small meals daily, asked to drink 6 to 8 glasses of water daily, and exercise 150 minutes weekly of cardio. Advised that we will recheck in 3 months. Send in rx to corrected pharmacy. Thanks!

## 2016-11-22 NOTE — Addendum Note (Signed)
Addended by: Adelina Mings on: 11/22/2016 01:37 PM   Modules accepted: Orders

## 2016-11-25 ENCOUNTER — Ambulatory Visit (AMBULATORY_SURGERY_CENTER): Payer: BLUE CROSS/BLUE SHIELD | Admitting: Internal Medicine

## 2016-11-25 ENCOUNTER — Encounter: Payer: Self-pay | Admitting: Internal Medicine

## 2016-11-25 VITALS — BP 100/58 | HR 69 | Temp 98.0°F | Resp 9 | Ht 67.0 in | Wt 175.0 lb

## 2016-11-25 DIAGNOSIS — K3189 Other diseases of stomach and duodenum: Secondary | ICD-10-CM | POA: Diagnosis not present

## 2016-11-25 DIAGNOSIS — Z1211 Encounter for screening for malignant neoplasm of colon: Secondary | ICD-10-CM

## 2016-11-25 DIAGNOSIS — D122 Benign neoplasm of ascending colon: Secondary | ICD-10-CM

## 2016-11-25 DIAGNOSIS — D120 Benign neoplasm of cecum: Secondary | ICD-10-CM

## 2016-11-25 DIAGNOSIS — D649 Anemia, unspecified: Secondary | ICD-10-CM

## 2016-11-25 DIAGNOSIS — K2950 Unspecified chronic gastritis without bleeding: Secondary | ICD-10-CM | POA: Diagnosis not present

## 2016-11-25 DIAGNOSIS — Z1212 Encounter for screening for malignant neoplasm of rectum: Secondary | ICD-10-CM | POA: Diagnosis not present

## 2016-11-25 DIAGNOSIS — D128 Benign neoplasm of rectum: Secondary | ICD-10-CM

## 2016-11-25 DIAGNOSIS — K219 Gastro-esophageal reflux disease without esophagitis: Secondary | ICD-10-CM | POA: Diagnosis not present

## 2016-11-25 MED ORDER — SODIUM CHLORIDE 0.9 % IV SOLN: 500.0000 mL | INTRAVENOUS | Status: DC

## 2016-11-25 NOTE — Op Note (Signed)
[place] Patient Name: Gregory Duncan Procedure Date: 11/25/2016 2:34 PM MRN: [id number] Endoscopist: Jerene Bears , MD Age: 61 Referring MD:  Date of Birth: [birth year] Gender: Male Account #: [id number] Procedure:                Colonoscopy Indications:              Screening for colorectal malignant neoplasm, This                            is the patient's first colonoscopy Medicines:                Monitored Anesthesia Care Procedure:                Pre-Anesthesia Assessment:                           - Prior to the procedure, a History and Physical                            was performed, and patient medications and                            allergies were reviewed. The patient's tolerance of                            previous anesthesia was also reviewed. The risks                            and benefits of the procedure and the sedation                            options and risks were discussed with the patient.                            All questions were answered, and informed consent                            was obtained. Prior Anticoagulants: The patient has                            taken no previous anticoagulant or antiplatelet                            agents. ASA Grade Assessment: II - A patient with                            mild systemic disease. After reviewing the risks                            and benefits, the patient was deemed in                            satisfactory condition to undergo the procedure.  After obtaining informed consent, the colonoscope                            was passed under direct vision. Throughout the                            procedure, the patient's blood pressure, pulse, and                            oxygen saturations were monitored continuously. The                            Colonoscope was introduced through the anus and                            advanced to the the cecum,  identified by                            appendiceal orifice and ileocecal valve. The                            colonoscopy was performed without difficulty. The                            patient tolerated the procedure well. The quality                            of the bowel preparation was good. The ileocecal                            valve, appendiceal orifice, and rectum were                            photographed. Scope In: 2:52:36 PM Scope Out: 3:04:28 PM Scope Withdrawal Time: 0 hours 9 minutes 20 seconds  Total Procedure Duration: 0 hours 11 minutes 52 seconds  Findings:                 The digital rectal exam was normal.                           A 2 mm polyp was found in the cecum. The polyp was                            sessile. The polyp was removed with a jumbo cold                            forceps. Resection and retrieval were complete.                           A 5 mm polyp was found in the ascending colon. The                            polyp was sessile. The polyp was  removed with a                            cold snare. Resection and retrieval were complete.                           A 5 mm polyp was found in the rectum. The polyp was                            sessile. The polyp was removed with a cold snare.                            Resection and retrieval were complete.                           Multiple small and large-mouthed diverticula were                            found in the sigmoid colon and descending colon.                           Internal hemorrhoids were found during                            retroflexion. The hemorrhoids were small. Complications:            No immediate complications. Estimated Blood Loss:     Estimated blood loss was minimal. Impression:               - One 2 mm polyp in the cecum, removed with a jumbo                            cold forceps. Resected and retrieved.                           - One 5 mm polyp in the  ascending colon, removed                            with a cold snare. Resected and retrieved.                           - One 5 mm polyp in the rectum, removed with a cold                            snare. Resected and retrieved.                           - Mild diverticulosis in the sigmoid colon and in                            the descending colon.                           - Internal hemorrhoids. Recommendation:           -  Patient has a contact number available for                            emergencies. The signs and symptoms of potential                            delayed complications were discussed with the                            patient. Return to normal activities tomorrow.                            Written discharge instructions were provided to the                            patient.                           - Resume previous diet.                           - Continue present medications.                           - Await pathology results.                           - Repeat colonoscopy is recommended. The                            colonoscopy date will be determined after pathology                            results from today's exam become available for                            review. Jerene Bears, MD 11/25/2016 3:14:02 PM This report has been signed electronically.

## 2016-11-25 NOTE — Patient Instructions (Signed)
YOU HAD AN ENDOSCOPIC PROCEDURE TODAY AT THE [facility]:   Refer to the procedure report that was given to you for any specific questions about what was found during the examination.  If the procedure report does not answer your questions, please call your gastroenterologist to clarify.  If you requested that your care partner not be given the details of your procedure findings, then the procedure report has been included in a sealed envelope for you to review at your convenience later.  YOU SHOULD EXPECT: Some feelings of bloating in the abdomen. Passage of more gas than usual.  Walking can help get rid of the air that was put into your GI tract during the procedure and reduce the bloating. If you had a lower endoscopy (such as a colonoscopy or flexible sigmoidoscopy) you may notice spotting of blood in your stool or on the toilet paper. If you underwent a bowel prep for your procedure, you may not have a normal bowel movement for a few days.  Please Note:  You might notice some irritation and congestion in your nose or some drainage.  This is from the oxygen used during your procedure.  There is no need for concern and it should clear up in a day or so.  SYMPTOMS TO REPORT IMMEDIATELY:   Following lower endoscopy (colonoscopy or flexible sigmoidoscopy):  Excessive amounts of blood in the stool  Significant tenderness or worsening of abdominal pains  Swelling of the abdomen that is new, acute  Fever of 100F or higher   Following upper endoscopy (EGD)  Vomiting of blood or coffee ground material  New chest pain or pain under the shoulder blades  Painful or persistently difficult swallowing  New shortness of breath  Fever of 100F or higher  Black, tarry-looking stools  For urgent or emergent issues, a gastroenterologist can be reached at any hour by calling [phone].   DIET:  We do recommend a small meal at first, but then you may proceed to your regular diet.  Drink  plenty of fluids but you should avoid alcoholic beverages for 24 hours.  ACTIVITY:  You should plan to take it easy for the rest of today and you should NOT DRIVE or use heavy machinery until tomorrow (because of the sedation medicines used during the test).    FOLLOW UP: Our staff will call the number listed on your records the next business day following your procedure to check on you and address any questions or concerns that you may have regarding the information given to you following your procedure. If we do not reach you, we will leave a message.  However, if you are feeling well and you are not experiencing any problems, there is no need to return our call.  We will assume that you have returned to your regular daily activities without incident.  If any biopsies were taken you will be contacted by phone or by letter within the next 1-3 weeks.  Please call us at [phone] if you have not heard about the biopsies in 3 weeks.    SIGNATURES/CONFIDENTIALITY: You and/or your care partner have signed paperwork which will be entered into your electronic medical record.  These signatures attest to the fact that that the information above on your After Visit Summary has been reviewed and is understood.  Full responsibility of the confidentiality of this discharge information lies with you and/or your care-partner.  Read all of the handouts given to you by your recovery   room nurse. 

## 2016-11-25 NOTE — Op Note (Signed)
[place] Patient Name: Gregory Duncan Procedure Date: 11/25/2016 2:34 PM MRN: [id number] Endoscopist: Jerene Bears , MD Age: 61 Referring MD:  Date of Birth: 1955-11-23 Gender: Male Account #: [id number] Procedure:                Upper GI endoscopy Indications:              Dysphagia, Gastro-esophageal reflux disease, Weight                            loss Medicines:                Monitored Anesthesia Care Procedure:                Pre-Anesthesia Assessment:                           - Prior to the procedure, a History and Physical                            was performed, and patient medications and                            allergies were reviewed. The patient's tolerance of                            previous anesthesia was also reviewed. The risks                            and benefits of the procedure and the sedation                            options and risks were discussed with the patient.                            All questions were answered, and informed consent                            was obtained. Prior Anticoagulants: The patient has                            taken no previous anticoagulant or antiplatelet                            agents. ASA Grade Assessment: II - A patient with                            mild systemic disease. After reviewing the risks                            and benefits, the patient was deemed in                            satisfactory condition to undergo the procedure.                           -  Prior to the procedure, a History and Physical                            was performed, and patient medications and                            allergies were reviewed. The patient's tolerance of                            previous anesthesia was also reviewed. The risks                            and benefits of the procedure and the sedation                            options and risks were discussed with the patient.                      All questions were answered, and informed consent                            was obtained. Prior Anticoagulants: The patient has                            taken no previous anticoagulant or antiplatelet                            agents. ASA Grade Assessment: II - A patient with                            mild systemic disease. After reviewing the risks                            and benefits, the patient was deemed in                            satisfactory condition to undergo the procedure.                           After obtaining informed consent, the endoscope was                            passed under direct vision. Throughout the                            procedure, the patient's blood pressure, pulse, and                            oxygen saturations were monitored continuously. The                            Endoscope was introduced through the mouth, and  advanced to the third part of duodenum. The upper                            GI endoscopy was accomplished without difficulty.                            The patient tolerated the procedure well. Scope In: Scope Out: Findings:                 Normal mucosa was found in the entire esophagus.                           A 3 cm hiatal hernia was present.                           A single 10 mm submucosal papule (nodule) with no                            bleeding was found on the greater curvature of the                            gastric body. Multiple bite-on-bite tunnelled                            biopsies were obtained with cold large-capacity                            forceps for histology in a targeted manner.                           Normal mucosa was found in the entire examined                            stomach. Biopsies were taken with a cold forceps                            for histology and Helicobacter pylori testing.                           The examined duodenum  was normal. Complications:            No immediate complications. Estimated Blood Loss:     Estimated blood loss was minimal. Impression:               - Normal mucosa was found in the entire esophagus.                           - 3 cm hiatal hernia.                           - A single submucosal papule (nodule) found in the                            stomach. Biopsied.                           -  Normal mucosa was found in the entire stomach.                            Biopsied.                           - Normal examined duodenum. Recommendation:           - Patient has a contact number available for                            emergencies. The signs and symptoms of potential                            delayed complications were discussed with the                            patient. Return to normal activities tomorrow.                            Written discharge instructions were provided to the                            patient.                           - Resume previous diet.                           - Continue present medications.                           - Await pathology results. Jerene Bears, MD 11/25/2016 3:11:48 PM This report has been signed electronically.

## 2016-11-25 NOTE — Progress Notes (Signed)
To PACU, VSS. Report to [email or web address] 

## 2016-11-26 ENCOUNTER — Telehealth: Payer: Self-pay

## 2016-11-26 NOTE — Telephone Encounter (Signed)
  Follow up Call-  Call back number 11/25/2016  Post procedure Call Back phone  # [phone] ( wife's cell #)  Permission to leave phone message Yes  Some recent data might be hidden     Patient questions:  Do you have a fever, pain , or abdominal swelling? No. Pain Score  0 *  Have you tolerated food without any problems? Yes.    Have you been able to return to your normal activities? Yes.    Do you have any questions about your discharge instructions: Diet   No. Medications  No. Follow up visit  No.  Do you have questions or concerns about your Care? No.  Actions: * If pain score is 4 or above: No action needed, pain <4.  No problems noted per pt. maw

## 2016-11-30 ENCOUNTER — Encounter: Payer: Self-pay | Admitting: Internal Medicine

## 2016-12-06 ENCOUNTER — Other Ambulatory Visit (HOSPITAL_BASED_OUTPATIENT_CLINIC_OR_DEPARTMENT_OTHER): Payer: BLUE CROSS/BLUE SHIELD

## 2016-12-06 ENCOUNTER — Encounter: Payer: Self-pay | Admitting: Oncology

## 2016-12-06 ENCOUNTER — Ambulatory Visit (HOSPITAL_BASED_OUTPATIENT_CLINIC_OR_DEPARTMENT_OTHER): Payer: BLUE CROSS/BLUE SHIELD | Admitting: Oncology

## 2016-12-06 VITALS — BP 150/76 | HR 57 | Temp 98.0°F | Resp 18 | Ht 67.0 in | Wt 178.5 lb

## 2016-12-06 DIAGNOSIS — D61818 Other pancytopenia: Secondary | ICD-10-CM | POA: Diagnosis not present

## 2016-12-06 DIAGNOSIS — E538 Deficiency of other specified B group vitamins: Secondary | ICD-10-CM

## 2016-12-06 LAB — CBC WITH DIFFERENTIAL/PLATELET
BASO%: 0.2 % (ref 0.0–2.0)
BASOS ABS: 0 10*3/uL (ref 0.0–0.1)
EOS ABS: 0.1 10*3/uL (ref 0.0–0.5)
EOS%: 2.7 % (ref 0.0–7.0)
HEMATOCRIT: 37.2 % — AB (ref 38.4–49.9)
HGB: 11.5 g/dL — ABNORMAL LOW (ref 13.0–17.1)
LYMPH%: 33 % (ref 14.0–49.0)
MCH: 28 pg (ref 27.2–33.4)
MCHC: 30.9 g/dL — AB (ref 32.0–36.0)
MCV: 90.5 fL (ref 79.3–98.0)
MONO#: 0.4 10*3/uL (ref 0.1–0.9)
MONO%: 8.2 % (ref 0.0–14.0)
NEUT#: 2.6 10*3/uL (ref 1.5–6.5)
NEUT%: 55.9 % (ref 39.0–75.0)
Platelets: 189 10*3/uL (ref 140–400)
RBC: 4.11 10*6/uL — ABNORMAL LOW (ref 4.20–5.82)
RDW: 14.9 % — ABNORMAL HIGH (ref 11.0–14.6)
WBC: 4.7 10*3/uL (ref 4.0–10.3)
lymph#: 1.6 10*3/uL (ref 0.9–3.3)

## 2016-12-06 LAB — COMPREHENSIVE METABOLIC PANEL
ALBUMIN: 3.9 g/dL (ref 3.5–5.0)
ALK PHOS: 78 U/L (ref 40–150)
ALT: 13 U/L (ref 0–55)
AST: 18 U/L (ref 5–34)
Anion Gap: 7 mEq/L (ref 3–11)
BUN: 7.4 mg/dL (ref 7.0–26.0)
CALCIUM: 9.3 mg/dL (ref 8.4–10.4)
CHLORIDE: 107 meq/L (ref 98–109)
CO2: 27 mEq/L (ref 22–29)
Creatinine: 0.8 mg/dL (ref 0.7–1.3)
Glucose: 115 mg/dl (ref 70–140)
POTASSIUM: 4.2 meq/L (ref 3.5–5.1)
Sodium: 141 mEq/L (ref 136–145)
Total Bilirubin: 0.87 mg/dL (ref 0.20–1.20)
Total Protein: 7.5 g/dL (ref 6.4–8.3)

## 2016-12-06 NOTE — Assessment & Plan Note (Signed)
This is a very pleasant 61 year old white male presented with severe pancytopenia. This is likely secondary to severe vitamin B 12 deficiency.   Patient's lab work was reviewed with the patient today.  His white blood cell count and platelets are now normal.  His hemoglobin has improved to 11.5.  Recommend that he continue his vitamin B12 injection once a month along with his folic acid and ferrous sulfate once a day.  Since his white blood cell count and platelets are now normal and his hemoglobin continues to improve, we will have him follow-up with his primary care provider for intermittent check of his CBC.  He has a physical scheduled in January and recommend that he have a CBC checked at that point in time.  We will see him back in our office on an as-needed basis.

## 2016-12-06 NOTE — Progress Notes (Signed)
[place] [place] OFFICE PROGRESS NOTE  Dorena Dew, FNP [address] [address]  DIAGNOSIS: Pancytopenia likely secondary to severe vitamin B12 deficiency. Patient presented with a vitamin B12 level of less than 50.  PRIOR THERAPY:  CURRENT THERAPY: Vitamin B12 1000 g monthly  INTERVAL HISTORY: Gregory Duncan 61 y.o. male returns for routine follow-up visit by himself.  The patient is feeling fine and has no specific complaints.  He continues to work full-time.  The patient has been taking his vitamin B12 injection monthly.  He remains on folic acid 1 mg and is able to take ferrous sulfate 325 mg once a day.  He has not been able to take it more than once a day due to GI irritation.  The patient denies fevers and chills.  Denies chest pain, shortness breath, cough, hemoptysis.  Denies nausea, vomiting, constipation, diarrhea.  The patient recently underwent an upper endoscopy and colonoscopy.  He is awaiting the results of the colonoscopy.  He states that they did remove several polyps.  Patient is here for evaluation and repeat blood work.  MEDICAL HISTORY: Past Medical History:  Diagnosis Date  . Anemia   . Blood transfusion without reported diagnosis   . GERD (gastroesophageal reflux disease)   . Hiatal hernia   . Hyperlipidemia   . Vitamin B12 deficiency     ALLERGIES:  has No Known Allergies.  MEDICATIONS:  Current Outpatient Medications  Medication Sig Dispense Refill  . atorvastatin (LIPITOR) 20 MG tablet Take 1 tablet (20 mg total) by mouth daily. 90 tablet 3  . bismuth subsalicylate (PEPTO BISMOL) 262 MG/15ML suspension Take 30 mLs by mouth every 6 (six) hours as needed for indigestion.    . calcium carbonate (TUMS - DOSED IN MG ELEMENTAL CALCIUM) 500 MG chewable tablet Chew 1 tablet by mouth daily as needed for indigestion or heartburn.    . cyanocobalamin (,VITAMIN B-12,) 1000 MCG/ML injection Use daily injections for 4 days, then  once per week for 4 weeks. 8 mL 0  . ferrous sulfate 325 (65 FE) MG EC tablet Take 1 tablet (325 mg total) by mouth 3 (three) times daily with meals. 90 tablet 0  . folic acid (FOLVITE) 932 MCG tablet Take 1 tablet (400 mcg total) by mouth daily. 30 tablet 0  . Insulin Syringe-Needle U-100 (RELI-ON INS SYR 1CC/30G) 30G 1 ML MISC Use with B12 injections. 8 each 0  . sucralfate (CARAFATE) 1 g tablet Take 1 tablet (1 g total) by mouth 4 (four) times daily -  with meals and at bedtime. Dissolve into liquid and drink 120 tablet 3  . Syringe, Disposable, 1 ML MISC Use with B12 injections 8 each 0   No current facility-administered medications for this visit.     SURGICAL HISTORY:  Past Surgical History:  Procedure Laterality Date  . eye cyst removed Left    as a child  . MOUTH SURGERY      REVIEW OF SYSTEMS:   Review of Systems  Constitutional: Negative for appetite change, chills, fatigue, fever and unexpected weight change.  HENT:   Negative for mouth sores, nosebleeds, sore throat and trouble swallowing.   Eyes: Negative for eye problems and icterus.  Respiratory: Negative for cough, hemoptysis, shortness of breath and wheezing.   Cardiovascular: Negative for chest pain and leg swelling.  Gastrointestinal: Negative for abdominal pain, constipation, diarrhea, nausea and vomiting.  Genitourinary: Negative for bladder incontinence, difficulty urinating, dysuria, frequency and  hematuria.   Musculoskeletal: Negative for back pain, gait problem, neck pain and neck stiffness.  Skin: Negative for itching and rash.  Neurological: Negative for dizziness, extremity weakness, gait problem, headaches, light-headedness and seizures.  Hematological: Negative for adenopathy. Does not bruise/bleed easily.  Psychiatric/Behavioral: Negative for confusion, depression and sleep disturbance. The patient is not nervous/anxious.     PHYSICAL EXAMINATION:  Blood pressure (!) 150/76, pulse (!) 57, temperature  98 F (36.7 C), temperature source Oral, resp. rate 18, height 5\' 7"  (1.702 m), weight 178 lb 8 oz (81 kg), SpO2 100 %.  ECOG PERFORMANCE STATUS: 0 - Asymptomatic  Physical Exam  Constitutional: Oriented to person, place, and time and well-developed, well-nourished, and in no distress. No distress.  HENT:  Head: Normocephalic and atraumatic.  Mouth/Throat: Oropharynx is clear and moist. No oropharyngeal exudate.  Eyes: Conjunctivae are normal. Right eye exhibits no discharge. Left eye exhibits no discharge. No scleral icterus.  Neck: Normal range of motion. Neck supple.  Cardiovascular: Normal rate, regular rhythm, normal heart sounds and intact distal pulses.   Pulmonary/Chest: Effort normal and breath sounds normal. No respiratory distress. No wheezes. No rales.  Abdominal: Soft. Bowel sounds are normal. Exhibits no distension and no mass. There is no tenderness.  Musculoskeletal: Normal range of motion. Exhibits no edema.  Lymphadenopathy:    No cervical adenopathy.  Neurological: Alert and oriented to person, place, and time. Exhibits normal muscle tone. Gait normal. Coordination normal.  Skin: Skin is warm and dry. No rash noted. Not diaphoretic. No erythema. No pallor.  Psychiatric: Mood, memory and judgment normal.  Vitals reviewed.  LABORATORY DATA: Lab Results  Component Value Date   WBC 4.7 12/06/2016   HGB 11.5 (L) 12/06/2016   HCT 37.2 (L) 12/06/2016   MCV 90.5 12/06/2016   PLT 189 12/06/2016      Chemistry      Component Value Date/Time   NA 141 12/06/2016 0930   K 4.2 12/06/2016 0930   CL 105 10/22/2016 0410   CO2 27 12/06/2016 0930   BUN 7.4 12/06/2016 0930   CREATININE 0.8 12/06/2016 0930      Component Value Date/Time   CALCIUM 9.3 12/06/2016 0930   ALKPHOS 78 12/06/2016 0930   AST 18 12/06/2016 0930   ALT 13 12/06/2016 0930   BILITOT 0.87 12/06/2016 0930       RADIOGRAPHIC STUDIES:  No results found.   ASSESSMENT/PLAN:  B12 deficiency This  is a very pleasant 61 year old white male presented with severe pancytopenia. This is likely secondary to severe vitamin B 12 deficiency.   Patient's lab work was reviewed with the patient today.  His white blood cell count and platelets are now normal.  His hemoglobin has improved to 11.5.  Recommend that he continue his vitamin B12 injection once a month along with his folic acid and ferrous sulfate once a day.  Since his white blood cell count and platelets are now normal and his hemoglobin continues to improve, we will have him follow-up with his primary care provider for intermittent check of his CBC.  He has a physical scheduled in January and recommend that he have a CBC checked at that point in time.  We will see him back in our office on an as-needed basis.  All questions were answered. The patient knows to call the clinic with any problems, questions or concerns. We can certainly see the patient much sooner if necessary.  No orders of the defined types were placed  in this encounter.   Mikey Bussing, DNP, AGPCNP-BC, AOCNP 12/06/16

## 2016-12-07 ENCOUNTER — Telehealth: Payer: Self-pay | Admitting: Oncology

## 2016-12-07 NOTE — Telephone Encounter (Signed)
Patient will follow up as needed per 11/5 los.

## 2016-12-10 ENCOUNTER — Telehealth: Payer: Self-pay | Admitting: Oncology

## 2016-12-10 NOTE — Telephone Encounter (Signed)
F/u as needed per 11/5 los.

## 2017-02-14 ENCOUNTER — Encounter: Payer: Self-pay | Admitting: Family Medicine

## 2017-02-14 ENCOUNTER — Ambulatory Visit (INDEPENDENT_AMBULATORY_CARE_PROVIDER_SITE_OTHER): Payer: BLUE CROSS/BLUE SHIELD | Admitting: Family Medicine

## 2017-02-14 VITALS — BP 148/88 | HR 72 | Temp 98.0°F | Resp 16 | Ht 67.0 in | Wt 193.0 lb

## 2017-02-14 DIAGNOSIS — E538 Deficiency of other specified B group vitamins: Secondary | ICD-10-CM

## 2017-02-14 DIAGNOSIS — D649 Anemia, unspecified: Secondary | ICD-10-CM | POA: Diagnosis not present

## 2017-02-14 DIAGNOSIS — E785 Hyperlipidemia, unspecified: Secondary | ICD-10-CM

## 2017-02-14 NOTE — Progress Notes (Signed)
Subjective:    Patient ID: Gregory Duncan, male    DOB: 1955/09/30, 62 y.o.   MRN: [id number]  HPI  Gregory Duncan, a 62 year old male presents for a 3 month follow up of vitamin B12 deficiency and anemia. Patient was admitted to inpatient services several months ago with worsening weakness. Patient was also found to have a B12 deficiency. Patient was discharged home on weekly B12 injections. Patient has been out of weekly injections over the past 2 weeks. Patient has been taking an OTC B12 supplement. He denies weakness, fatigue, shortness of breath, chest pain, abdominal pain, nausea, vomiting, or diarrhea. Patient has also continued ferrous sulfate for iron deficiency anemia.   Past Medical History:  Diagnosis Date  . Anemia   . Blood transfusion without reported diagnosis   . GERD (gastroesophageal reflux disease)   . Hiatal hernia   . Hyperlipidemia   . Vitamin B12 deficiency    Social History   Socioeconomic History  . Marital status: Married    Spouse name: Not on file  . Number of children: 0  . Years of education: Not on file  . Highest education level: Not on file  Social Needs  . Financial resource strain: Not on file  . Food insecurity - worry: Not on file  . Food insecurity - inability: Not on file  . Transportation needs - medical: Not on file  . Transportation needs - non-medical: Not on file  Occupational History  . Occupation: [employer]  Tobacco Use  . Smoking status: Never Smoker  . Smokeless tobacco: Never Used  Substance and Sexual Activity  . Alcohol use: No  . Drug use: No  . Sexual activity: Not on file  Other Topics Concern  . Not on file  Social History Narrative  . Not on file   Immunization History  Administered Date(s) Administered  . Influenza,inj,Quad PF,6+ Mos 11/12/2016  . Tdap 11/12/2016    Review of Systems  Constitutional: Negative for fatigue, fever and unexpected weight change.  HENT: Negative.   Respiratory: Negative.    Gastrointestinal: Negative.   Endocrine: Negative for polydipsia, polyphagia and polyuria.  Genitourinary: Negative.   Musculoskeletal: Negative.   Skin: Negative.   Allergic/Immunologic: Negative for immunocompromised state.  Neurological: Negative.  Negative for dizziness, seizures and facial asymmetry.  Hematological: Negative.  Negative for adenopathy.  Psychiatric/Behavioral: Negative.        Objective:   Physical Exam  Constitutional: He is oriented to person, place, and time.  HENT:  Head: Normocephalic and atraumatic.  Right Ear: External ear normal.  Left Ear: External ear normal.  Nose: Nose normal.  Mouth/Throat: Oropharynx is clear and moist.  Eyes: Conjunctivae and EOM are normal. Pupils are equal, round, and reactive to light.  Neck: Normal range of motion. Neck supple.  Cardiovascular: Normal rate, regular rhythm, normal heart sounds and intact distal pulses.  Pulmonary/Chest: Effort normal and breath sounds normal.  Abdominal: Soft. Bowel sounds are normal.  Musculoskeletal: Normal range of motion.  Neurological: He is alert and oriented to person, place, and time. He has normal reflexes.  Skin: Skin is warm and dry.  Psychiatric: He has a normal mood and affect. His behavior is normal. Judgment and thought content normal.     BP (!) 148/88 (BP Location: Right Arm, Patient Position: Sitting, Cuff Size: Normal) Comment: manually  Pulse 72   Temp 98 F (36.7 C) (Oral)   Resp 16   Ht 5\' 7"  (1.702 m)  Wt 193 lb (87.5 kg)   SpO2 99%   BMI 30.23 kg/m  Assessment & Plan:   B12 deficiency - Vitamin B12   Symptomatic anemia  - CBC with Differential  Hyperlipidemia, unspecified hyperlipidemia type Recommend a lowfat, low carbohydrate diet divided over 5-6 small meals, increase water intake to 6-8 glasses, and 150 minutes per week of cardiovascular exercise.   The 10-year ASCVD risk score Mikey Bussing DC Brooke Bonito., et al., 2013) is: 11.2%   Values used to calculate the  score:     Age: 34 years     Sex: Male     Is Non-Hispanic African American: No     Diabetic: No     Tobacco smoker: No     Systolic Blood Pressure: 403 mmHg     Is BP treated: No     HDL Cholesterol: 54 mg/dL     Total Cholesterol: 216 mg/dL  - Lipid Panel; Future    Health maintenance: Recommend the following:  Yearly eye exam Yearly dental exam Colonoscopy Cholesterol screening  Prostate exam  RTC; Will follow up in 3 months for a complete physical exam    Donia Pounds  MSN, FNP-C [facility] [address] [place], [place] [address] [phone]

## 2017-02-14 NOTE — Patient Instructions (Signed)
Anemia Anemia is a condition in which you do not have enough red blood cells or hemoglobin. Hemoglobin is a substance in red blood cells that carries oxygen. When you do not have enough red blood cells or hemoglobin (are anemic), your body cannot get enough oxygen and your organs may not work properly. As a result, you may feel very tired or have other problems. What are the causes? Common causes of anemia include:  Excessive bleeding. Anemia can be caused by excessive bleeding inside or outside the body, including bleeding from the intestine or from periods in women.  Poor nutrition.  Long-lasting (chronic) kidney, thyroid, and liver disease.  Bone marrow disorders.  Cancer and treatments for cancer.  HIV (human immunodeficiency virus) and AIDS (acquired immunodeficiency syndrome).  Treatments for HIV and AIDS.  Spleen problems.  Blood disorders.  Infections, medicines, and autoimmune disorders that destroy red blood cells.  What are the signs or symptoms? Symptoms of this condition include:  Minor weakness.  Dizziness.  Headache.  Feeling heartbeats that are irregular or faster than normal (palpitations).  Shortness of breath, especially with exercise.  Paleness.  Cold sensitivity.  Indigestion.  Nausea.  Difficulty sleeping.  Difficulty concentrating.  Symptoms may occur suddenly or develop slowly. If your anemia is mild, you may not have symptoms. How is this diagnosed? This condition is diagnosed based on:  Blood tests.  Your medical history.  A physical exam.  Bone marrow biopsy.  Your health care provider may also check your stool (feces) for blood and may do additional testing to look for the cause of your bleeding. You may also have other tests, including:  Imaging tests, such as a CT scan or MRI.  Endoscopy.  Colonoscopy.  How is this treated? Treatment for this condition depends on the cause. If you continue to lose a lot of blood,  you may need to be treated at a hospital. Treatment may include:  Taking supplements of iron, vitamin B12, or folic acid.  Taking a hormone medicine (erythropoietin) that can help to stimulate red blood cell growth.  Having a blood transfusion. This may be needed if you lose a lot of blood.  Making changes to your diet.  Having surgery to remove your spleen.  Follow these instructions at home:  Take over-the-counter and prescription medicines only as told by your health care provider.  Take supplements only as told by your health care provider.  Follow any diet instructions that you were given.  Keep all follow-up visits as told by your health care provider. This is important. Contact a health care provider if:  You develop new bleeding anywhere in the body. Get help right away if:  You are very weak.  You are short of breath.  You have pain in your abdomen or chest.  You are dizzy or feel faint.  You have trouble concentrating.  You have bloody or black, tarry stools.  You vomit repeatedly or you vomit up blood. Summary  Anemia is a condition in which you do not have enough red blood cells or enough of a substance in your red blood cells that carries oxygen (hemoglobin).  Symptoms may occur suddenly or develop slowly.  If your anemia is mild, you may not have symptoms.  This condition is diagnosed with blood tests as well as a medical history and physical exam. Other tests may be needed.  Treatment for this condition depends on the cause of the anemia. This information is not intended to replace advice   given to you by your health care provider. Make sure you discuss any questions you have with your health care provider. Document Released: 02/26/2004 Document Revised: 02/20/2016 Document Reviewed: 02/20/2016 Elsevier Interactive Patient Education  [employer].

## 2017-02-15 ENCOUNTER — Other Ambulatory Visit: Payer: Self-pay | Admitting: Family Medicine

## 2017-02-15 ENCOUNTER — Telehealth: Payer: Self-pay

## 2017-02-15 DIAGNOSIS — E538 Deficiency of other specified B group vitamins: Secondary | ICD-10-CM

## 2017-02-15 LAB — CBC WITH DIFFERENTIAL/PLATELET
BASOS: 0 %
Basophils Absolute: 0 10*3/uL (ref 0.0–0.2)
EOS (ABSOLUTE): 0.2 10*3/uL (ref 0.0–0.4)
EOS: 3 %
HEMATOCRIT: 43.9 % (ref 37.5–51.0)
HEMOGLOBIN: 14.4 g/dL (ref 13.0–17.7)
IMMATURE GRANS (ABS): 0 10*3/uL (ref 0.0–0.1)
IMMATURE GRANULOCYTES: 0 %
LYMPHS: 34 %
Lymphocytes Absolute: 2 10*3/uL (ref 0.7–3.1)
MCH: 27.2 pg (ref 26.6–33.0)
MCHC: 32.8 g/dL (ref 31.5–35.7)
MCV: 83 fL (ref 79–97)
MONOCYTES: 12 %
Monocytes Absolute: 0.7 10*3/uL (ref 0.1–0.9)
Neutrophils Absolute: 3 10*3/uL (ref 1.4–7.0)
Neutrophils: 51 %
Platelets: 163 10*3/uL (ref 150–379)
RBC: 5.3 x10E6/uL (ref 4.14–5.80)
RDW: 15.1 % (ref 12.3–15.4)
WBC: 5.8 10*3/uL (ref 3.4–10.8)

## 2017-02-15 LAB — VITAMIN B12: Vitamin B-12: 180 pg/mL — ABNORMAL LOW (ref 232–1245)

## 2017-02-15 MED ORDER — CYANOCOBALAMIN 1000 MCG/ML IJ SOLN: INTRAMUSCULAR | 0 refills | Status: DC

## 2017-02-15 NOTE — Telephone Encounter (Signed)
Called no answer. Left a message for patient to call back when available. Thanks!

## 2017-02-15 NOTE — Telephone Encounter (Signed)
-----   Message from Dorena Dew, [place] sent at 02/15/2017 11:26 AM EST ----- Regarding: Lab results Please inform patient that he continues to have a B12 deficiency.  He will continue weekly B12 injections for the next 4 weeks.  Please schedule lab appointment for B12 level in 6 weeks.  Also, inform patient that anemia has resolved.  However continue iron rich diet as discussed during previous appointment.  Thanks

## 2017-02-15 NOTE — Progress Notes (Signed)
Gregory Duncan, a 62 year old male with a history of B12 deficiency was evaluated in office on 02/14/2017.  Reviewed labs, patient was found to have B12 deficiency.  Will continue weekly B12 injections and recheck level in 6 weeks. Meds ordered this encounter  Medications  . cyanocobalamin (,VITAMIN B-12,) 1000 MCG/ML injection    Sig: Injection once weekly for 4 weeks    Dispense:  8 mL    Refill:  0   Donia Pounds  MSN, FNP-C [facility] [address] [place], [place] [address] [phone]

## 2017-02-16 NOTE — Telephone Encounter (Signed)
Called and spoke with patient. Advised that b12 was still low and he needs to continue b12 injections for the next 4 weeks and then come in at 6 weeks for a lab draw. Advised that anemia has resolved but to continue to eat a iron rich diet. Thanks!

## 2017-02-17 ENCOUNTER — Telehealth: Payer: Self-pay

## 2017-02-17 MED ORDER — SYRINGE (DISPOSABLE) 1 ML MISC: 0 refills | Status: DC

## 2017-02-17 NOTE — Telephone Encounter (Signed)
This has been sent into pharmacy. Thanks!  

## 2017-03-28 ENCOUNTER — Other Ambulatory Visit: Payer: BLUE CROSS/BLUE SHIELD

## 2017-03-28 DIAGNOSIS — E538 Deficiency of other specified B group vitamins: Secondary | ICD-10-CM

## 2017-03-28 DIAGNOSIS — E785 Hyperlipidemia, unspecified: Secondary | ICD-10-CM

## 2017-03-29 LAB — LIPID PANEL
CHOL/HDL RATIO: 2.3 ratio (ref 0.0–5.0)
Cholesterol, Total: 122 mg/dL (ref 100–199)
HDL: 54 mg/dL (ref >39)
LDL CALC: 55 mg/dL (ref 0–99)
Triglycerides: 67 mg/dL (ref 0–149)
VLDL CHOLESTEROL CAL: 13 mg/dL (ref 5–40)

## 2017-03-30 ENCOUNTER — Telehealth: Payer: Self-pay

## 2017-03-30 NOTE — Telephone Encounter (Signed)
Called and advised that lipid panel was within normal range. Recommended to continue low fat/ low carb diet over 5 to 6 small meals daily, asked to drink 6-8 glasses and exercise 150 minutes weekly of cardio. Advised that we will recheck in 1 year.

## 2017-03-30 NOTE — Telephone Encounter (Signed)
-----   Message from Dorena Dew, [place] sent at 03/29/2017 12:33 PM EST ----- Regarding: lab results Please inform patient that fasting lipid panel is within a normal range. Recommend a lowfat, low carbohydrate diet divided over 5-6 small meals, increase water intake to 6-8 glasses, and 150 minutes per week of cardiovascular exercise. Will recheck cholesterol panel in 1 year.   Thanks

## 2017-03-31 LAB — SPECIMEN STATUS REPORT

## 2017-03-31 LAB — VITAMIN B12: Vitamin B-12: 517 pg/mL (ref 232–1245)

## 2017-05-17 ENCOUNTER — Other Ambulatory Visit: Payer: Self-pay | Admitting: Family Medicine

## 2017-05-17 DIAGNOSIS — E538 Deficiency of other specified B group vitamins: Secondary | ICD-10-CM

## 2017-08-15 ENCOUNTER — Ambulatory Visit (INDEPENDENT_AMBULATORY_CARE_PROVIDER_SITE_OTHER): Payer: BLUE CROSS/BLUE SHIELD | Admitting: Family Medicine

## 2017-08-15 ENCOUNTER — Encounter: Payer: Self-pay | Admitting: Family Medicine

## 2017-08-15 VITALS — BP 120/72 | HR 58 | Temp 97.9°F | Resp 16 | Ht 67.0 in | Wt 205.0 lb

## 2017-08-15 DIAGNOSIS — K219 Gastro-esophageal reflux disease without esophagitis: Secondary | ICD-10-CM

## 2017-08-15 DIAGNOSIS — E785 Hyperlipidemia, unspecified: Secondary | ICD-10-CM

## 2017-08-15 DIAGNOSIS — D649 Anemia, unspecified: Secondary | ICD-10-CM

## 2017-08-15 DIAGNOSIS — E538 Deficiency of other specified B group vitamins: Secondary | ICD-10-CM

## 2017-08-15 DIAGNOSIS — D61818 Other pancytopenia: Secondary | ICD-10-CM | POA: Diagnosis not present

## 2017-08-15 MED ORDER — ATORVASTATIN CALCIUM 20 MG PO TABS: 20.0000 mg | ORAL_TABLET | Freq: Every day | ORAL | 3 refills | Status: DC

## 2017-08-15 MED ORDER — SYRINGE (DISPOSABLE) 1 ML MISC: 0 refills | Status: AC

## 2017-08-15 MED ORDER — SUCRALFATE 1 G PO TABS: 1.0000 g | ORAL_TABLET | Freq: Three times a day (TID) | ORAL | 3 refills | Status: AC

## 2017-08-15 MED ORDER — CYANOCOBALAMIN 1000 MCG/ML IJ SOLN: INTRAMUSCULAR | 0 refills | Status: AC

## 2017-08-15 MED ORDER — FOLIC ACID 400 MCG PO TABS: 400.0000 ug | ORAL_TABLET | Freq: Every day | ORAL | 0 refills | Status: AC

## 2017-08-15 MED ORDER — FERROUS SULFATE 325 (65 FE) MG PO TBEC: 325.0000 mg | DELAYED_RELEASE_TABLET | Freq: Three times a day (TID) | ORAL | 0 refills | Status: AC

## 2017-08-15 NOTE — Patient Instructions (Signed)
Cholesterol Cholesterol is a fat. Your body needs a small amount of cholesterol. Cholesterol (plaque) may build up in your blood vessels (arteries). That makes you more likely to have a heart attack or stroke. You cannot feel your cholesterol level. Having a blood test is the only way to find out if your level is high. Keep your test results. Work with your doctor to keep your cholesterol at a good level. What do the results mean?  Total cholesterol is how much cholesterol is in your blood.  LDL is bad cholesterol. This is the type that can build up. Try to have low LDL.  HDL is good cholesterol. It cleans your blood vessels and carries LDL away. Try to have high HDL.  Triglycerides are fat that the body can store or burn for energy. What are good levels of cholesterol?  Total cholesterol below 200.  LDL below 100 is good for people who have health risks. LDL below 70 is good for people who have very high risks.  HDL above 40 is good. It is best to have HDL of 60 or higher.  Triglycerides below 150. How can I lower my cholesterol? Diet Follow your diet program as told by your doctor.  Choose fish, white meat chicken, or turkey that is roasted or baked. Try not to eat red meat, fried foods, sausage, or lunch meats.  Eat lots of fresh fruits and vegetables.  Choose whole grains, beans, pasta, potatoes, and cereals.  Choose olive oil, corn oil, or canola oil. Only use small amounts.  Try not to eat butter, mayonnaise, shortening, or palm kernel oils.  Try not to eat foods with trans fats.  Choose low-fat or nonfat dairy foods. ? Drink skim or nonfat milk. ? Eat low-fat or nonfat yogurt and cheeses. ? Try not to drink whole milk or cream. ? Try not to eat ice cream, egg yolks, or full-fat cheeses.  Healthy desserts include angel food cake, ginger snaps, animal crackers, hard candy, popsicles, and low-fat or nonfat frozen yogurt. Try not to eat pastries, cakes, pies, and  cookies.  Exercise Follow your exercise program as told by your doctor.  Be more active. Try gardening, walking, and taking the stairs.  Ask your doctor about ways that you can be more active.  Medicine  Take over-the-counter and prescription medicines only as told by your doctor. This information is not intended to replace advice given to you by your health care provider. Make sure you discuss any questions you have with your health care provider. Document Released: 04/16/2008 Document Revised: 08/20/2015 Document Reviewed: 07/31/2015 Elsevier Interactive Patient Education  2018 Elsevier Inc.  

## 2017-08-15 NOTE — Progress Notes (Signed)
    Subjective   Gregory Duncan 62 y.o. male  [id number]  [id number]  28-May-1955    Chief Complaint  Patient presents with  . Gastroesophageal Reflux  . Follow-up    b 12 deficiency     Gregory Duncan, a 62 year old male presents for a follow up of vitamin B12 deficiency and anemia. Patient was admitted to inpatient services several months ago with worsening weakness. Patient was also found to have a B12 deficiency. Patient was discharged home on weekly B12 injections. He denies weakness, fatigue, shortness of breath, chest pain, abdominal pain, nausea, vomiting, or diarrhea. Patient has also continued ferrous sulfate for iron deficiency anemia. Patient states that he is feeling better and has started gaining weight.    Review of Systems  Constitutional: Negative.   Respiratory: Negative.   Cardiovascular: Negative.   Gastrointestinal: Negative.   Musculoskeletal: Negative.   Neurological: Negative.   Psychiatric/Behavioral: Negative.     Objective   Physical Exam  Constitutional: He is oriented to person, place, and time. He appears well-developed and well-nourished.  HENT:  Head: Normocephalic and atraumatic.  Right Ear: External ear normal.  Left Ear: External ear normal.  Nose: Nose normal.  Mouth/Throat: Oropharynx is clear and moist.  Eyes: Pupils are equal, round, and reactive to light. Conjunctivae and EOM are normal.  Neck: Normal range of motion. Neck supple. No tracheal deviation present. No thyromegaly present.  Cardiovascular: Normal rate, regular rhythm, normal heart sounds and intact distal pulses. Exam reveals no friction rub.  No murmur heard. Pulmonary/Chest: Effort normal and breath sounds normal. No stridor. No respiratory distress. He has no wheezes.  Abdominal: Soft. Bowel sounds are normal. He exhibits no distension and no mass. There is no tenderness.  Musculoskeletal: Normal range of motion.  Lymphadenopathy:    He has no cervical adenopathy.   Neurological: He is alert and oriented to person, place, and time.  Skin: Skin is warm and dry.  Psychiatric: He has a normal mood and affect. His behavior is normal. Judgment and thought content normal.  Nursing note and vitals reviewed.   BP 120/72 (BP Location: Left Arm, Patient Position: Sitting, Cuff Size: Normal) Comment: manually  Pulse (!) 58   Temp 97.9 F (36.6 C) (Oral)   Resp 16   Ht 5\' 7"  (1.702 m)   Wt 205 lb (93 kg)   SpO2 98%   BMI 32.11 kg/m   Assessment   Encounter Diagnoses  Name Primary?  . B12 deficiency   . Hyperlipidemia, unspecified hyperlipidemia type Yes  . Gastroesophageal reflux disease without esophagitis   . Symptomatic anemia   . Hyperlipidemia LDL goal <100      Plan  1. B12 deficiency  - Vitamin B12; Future - cyanocobalamin (,VITAMIN B-12,) 1000 MCG/ML injection; INJECT 1 mL ONCE WEEKLY FOR 4 WEEKS  Dispense: 10 mL; Refill: 0  2. Hyperlipidemia, unspecified hyperlipidemia type - Lipid Panel; Future - CBC with Differential; Future - Comprehensive metabolic panel; Future  3. Gastroesophageal reflux disease without esophagitis Continue with current medications   4. Symptomatic anemia - Iron, TIBC and Ferritin Panel; Future  5. Hyperlipidemia LDL goal <100 - atorvastatin (LIPITOR) 20 MG tablet; Take 1 tablet (20 mg total) by mouth daily.  Dispense: 90 tablet; Refill: 3     This note has been created with Surveyor, quantity. Any transcriptional errors are unintentional.

## 2017-08-17 ENCOUNTER — Other Ambulatory Visit: Payer: BLUE CROSS/BLUE SHIELD

## 2017-08-17 DIAGNOSIS — E785 Hyperlipidemia, unspecified: Secondary | ICD-10-CM

## 2017-08-17 DIAGNOSIS — D649 Anemia, unspecified: Secondary | ICD-10-CM

## 2017-08-17 DIAGNOSIS — E538 Deficiency of other specified B group vitamins: Secondary | ICD-10-CM

## 2017-08-18 LAB — CBC WITH DIFFERENTIAL/PLATELET
Basophils Absolute: 0 10*3/uL (ref 0.0–0.2)
Basos: 0 %
EOS (ABSOLUTE): 0.1 10*3/uL (ref 0.0–0.4)
Eos: 2 %
Hematocrit: 44 % (ref 37.5–51.0)
Hemoglobin: 14.4 g/dL (ref 13.0–17.7)
Immature Grans (Abs): 0 10*3/uL (ref 0.0–0.1)
Immature Granulocytes: 0 %
Lymphocytes Absolute: 1.7 10*3/uL (ref 0.7–3.1)
Lymphs: 30 %
MCH: 28.8 pg (ref 26.6–33.0)
MCHC: 32.7 g/dL (ref 31.5–35.7)
MCV: 88 fL (ref 79–97)
Monocytes Absolute: 0.4 10*3/uL (ref 0.1–0.9)
Monocytes: 8 %
Neutrophils Absolute: 3.4 10*3/uL (ref 1.4–7.0)
Neutrophils: 60 %
Platelets: 171 10*3/uL (ref 150–450)
RBC: 5 x10E6/uL (ref 4.14–5.80)
RDW: 14.2 % (ref 12.3–15.4)
WBC: 5.7 10*3/uL (ref 3.4–10.8)

## 2017-08-18 LAB — IRON,TIBC AND FERRITIN PANEL
Ferritin: 165 ng/mL (ref 30–400)
Iron Saturation: 23 % (ref 15–55)
Iron: 65 ug/dL (ref 38–169)
Total Iron Binding Capacity: 282 ug/dL (ref 250–450)
UIBC: 217 ug/dL (ref 111–343)

## 2017-08-18 LAB — LIPID PANEL
Chol/HDL Ratio: 2.8 ratio (ref 0.0–5.0)
Cholesterol, Total: 141 mg/dL (ref 100–199)
HDL: 50 mg/dL (ref >39)
LDL Calculated: 75 mg/dL (ref 0–99)
Triglycerides: 82 mg/dL (ref 0–149)
VLDL Cholesterol Cal: 16 mg/dL (ref 5–40)

## 2017-08-18 LAB — COMPREHENSIVE METABOLIC PANEL
ALT: 22 IU/L (ref 0–44)
AST: 24 IU/L (ref 0–40)
Albumin/Globulin Ratio: 1.9 (ref 1.2–2.2)
Albumin: 4.7 g/dL (ref 3.6–4.8)
Alkaline Phosphatase: 115 IU/L (ref 39–117)
BUN/Creatinine Ratio: 11 (ref 10–24)
BUN: 10 mg/dL (ref 8–27)
Bilirubin Total: 1.2 mg/dL (ref 0.0–1.2)
CO2: 25 mmol/L (ref 20–29)
Calcium: 9.4 mg/dL (ref 8.6–10.2)
Chloride: 102 mmol/L (ref 96–106)
Creatinine, Ser: 0.88 mg/dL (ref 0.76–1.27)
GFR calc Af Amer: 108 mL/min/{1.73_m2} (ref >59)
GFR calc non Af Amer: 93 mL/min/{1.73_m2} (ref >59)
Globulin, Total: 2.5 g/dL (ref 1.5–4.5)
Glucose: 89 mg/dL (ref 65–99)
Potassium: 4.1 mmol/L (ref 3.5–5.2)
Sodium: 141 mmol/L (ref 134–144)
Total Protein: 7.2 g/dL (ref 6.0–8.5)

## 2017-08-18 LAB — VITAMIN B12: Vitamin B-12: 376 pg/mL (ref 232–1245)

## 2018-02-15 ENCOUNTER — Ambulatory Visit: Payer: BLUE CROSS/BLUE SHIELD | Admitting: Family Medicine

## 2018-03-30 ENCOUNTER — Other Ambulatory Visit: Payer: Self-pay | Admitting: Family Medicine

## 2018-08-19 ENCOUNTER — Other Ambulatory Visit: Payer: Self-pay | Admitting: Family Medicine

## 2018-08-19 DIAGNOSIS — E538 Deficiency of other specified B group vitamins: Secondary | ICD-10-CM

## 2018-08-21 ENCOUNTER — Telehealth: Payer: Self-pay

## 2018-08-21 NOTE — Telephone Encounter (Signed)
Called and advised patient needs an appointment before refills can be sent in. Thanks!

## 2018-10-11 ENCOUNTER — Encounter (HOSPITAL_COMMUNITY): Payer: Self-pay

## 2018-10-11 ENCOUNTER — Encounter (HOSPITAL_COMMUNITY): Payer: Self-pay | Admitting: *Deleted

## 2018-10-23 ENCOUNTER — Other Ambulatory Visit: Payer: Self-pay | Admitting: Family Medicine

## 2018-10-23 DIAGNOSIS — E785 Hyperlipidemia, unspecified: Secondary | ICD-10-CM

## 2018-11-14 ENCOUNTER — Ambulatory Visit (INDEPENDENT_AMBULATORY_CARE_PROVIDER_SITE_OTHER): Payer: BC Managed Care – PPO | Admitting: Family Medicine

## 2018-11-14 ENCOUNTER — Other Ambulatory Visit: Payer: BC Managed Care – PPO

## 2018-11-14 ENCOUNTER — Encounter: Payer: Self-pay | Admitting: Family Medicine

## 2018-11-14 ENCOUNTER — Other Ambulatory Visit: Payer: Self-pay

## 2018-11-14 ENCOUNTER — Other Ambulatory Visit: Payer: Self-pay | Admitting: Family Medicine

## 2018-11-14 VITALS — BP 142/86 | HR 62 | Temp 97.9°F | Resp 16 | Ht 68.0 in | Wt 214.0 lb

## 2018-11-14 DIAGNOSIS — Z862 Personal history of diseases of the blood and blood-forming organs and certain disorders involving the immune mechanism: Secondary | ICD-10-CM | POA: Insufficient documentation

## 2018-11-14 DIAGNOSIS — E538 Deficiency of other specified B group vitamins: Secondary | ICD-10-CM

## 2018-11-14 DIAGNOSIS — Z13220 Encounter for screening for lipoid disorders: Secondary | ICD-10-CM

## 2018-11-14 DIAGNOSIS — Z8639 Personal history of other endocrine, nutritional and metabolic disease: Secondary | ICD-10-CM

## 2018-11-14 DIAGNOSIS — Z131 Encounter for screening for diabetes mellitus: Secondary | ICD-10-CM

## 2018-11-14 DIAGNOSIS — D649 Anemia, unspecified: Secondary | ICD-10-CM

## 2018-11-14 LAB — POCT GLYCOSYLATED HEMOGLOBIN (HGB A1C): Hemoglobin A1C: 5.4 % (ref 4.0–5.6)

## 2018-11-14 NOTE — Patient Instructions (Signed)
Vitamin B12 Deficiency Vitamin B12 deficiency means that your body does not have enough vitamin B12. The body needs this vitamin:  To make red blood cells.  To make genes (DNA).  To help the nerves work. If you do not have enough vitamin B12 in your body, you can have health problems. What are the causes?  Not eating enough foods that contain vitamin B12.  Not being able to absorb vitamin B12 from the food that you eat.  Certain digestive system diseases.  A condition in which the body does not make enough of a certain protein, which results in too few red blood cells (pernicious anemia).  Having a surgery in which part of the stomach or small intestine is removed.  Taking medicines that make it hard for the body to absorb vitamin B12. These medicines include: ? Heartburn medicines. ? Some antibiotic medicines. ? Other medicines that are used to treat certain conditions. What increases the risk?  Being older than age 50.  Eating a vegetarian or vegan diet, especially while you are pregnant.  Eating a poor diet while you are pregnant.  Taking certain medicines.  Having alcoholism. What are the signs or symptoms? In some cases, there are no symptoms. If the condition leads to too few blood cells or nerve damage, symptoms can occur, such as:  Feeling weak.  Feeling tired (fatigued).  Not being hungry.  Weight loss.  A loss of feeling (numbness) or tingling in your hands and feet.  Redness and burning of the tongue.  Being mixed up (confused) or having memory problems.  Sadness (depression).  Problems with your senses. This can include color blindness, ringing in the ears, or loss of taste.  Watery poop (diarrhea) or trouble pooping (constipation).  Trouble walking. If anemia is very bad, symptoms can include:  Being short of breath.  Being dizzy.  Having a very fast heartbeat. How is this treated?  Changing the way you eat and drink, such as: ?  Eating more foods that contain vitamin B12. ? Drinking little or no alcohol.  Getting vitamin B12 shots.  Taking vitamin B12 supplements. Your doctor will tell you the dose that is best for you. Follow these instructions at home: Eating and drinking   Eat lots of healthy foods that contain vitamin B12. These include: ? Meats and poultry, such as beef, pork, chicken, turkey, and organ meats, such as liver. ? Seafood, such as clams, rainbow trout, salmon, tuna, and haddock. ? Eggs. ? Cereal and dairy products that have vitamin B12 added to them. Check the label. The items listed above may not be a complete list of what you can eat and drink. Contact a dietitian for more options. General instructions  Get any shots as told by your doctor.  Take supplements only as told by your doctor.  Do not drink alcohol if your doctor tells you not to. In some cases, you may only be asked to limit alcohol use.  Keep all follow-up visits as told by your doctor. This is important. Contact a doctor if:  Your symptoms come back. Get help right away if:  You have trouble breathing.  You have a very fast heartbeat.  You have chest pain.  You get dizzy.  You pass out. Summary  Vitamin B12 deficiency means that your body is not getting enough vitamin B12.  In some cases, there are no symptoms of this condition.  Treatment may include making a change in the way you eat and drink,   getting vitamin B12 shots, or taking supplements.  Eat lots of healthy foods that contain vitamin B12. This information is not intended to replace advice given to you by your health care provider. Make sure you discuss any questions you have with your health care provider. Document Released: 01/07/2011 Document Revised: 09/27/2017 Document Reviewed: 09/27/2017 Elsevier Patient Education  2020 [employer] Inc.  

## 2018-11-14 NOTE — Progress Notes (Signed)
Established Patient Office Visit  Subjective:  Patient ID: Gregory Duncan, male    DOB: [birth year]  Age: 63 y.o. MRN: [id number]  CC:  Chief Complaint  Patient presents with  . Hyperlipidemia  . Follow-up    b 12 deficiency  . Knee Pain    both knees     HPI Gregory Duncan, a 63 year old male with a medical history significant for symptomatic anemia, b12 deficiency, and GERD presents for a follow up of chronic conditions.  Patient states that he has been feeling well, he has no new complaints.  He endorses discomfort in bilateral knees.  He works as a Clinical research associate at [employer] and has been working overnight. Patient has a history of B12 deficiency and symptomatic anemia.  He has been taking over-the-counter supplements over the past several months.  He was previously on B12 injections for this problem.  His last B12 level was within normal range. Patient denies dizziness, shortness of breath, headache, paresthesias, heart palpitations, dysuria, nausea, vomiting, or diarrhea.  He states that he has not been following a low-fat, low carbohydrate diet.  He does not exercise consistently, but is very active on his job.  Patient is a non-smoker.  Patient's last prostate exam was greater than 1 year ago.  Patient is s/p colonoscopy, will repeat in 5 years.  Past Medical History:  Diagnosis Date  . Anemia   . Blood transfusion without reported diagnosis   . GERD (gastroesophageal reflux disease)   . Hiatal hernia   . Hyperlipidemia   . Vitamin B12 deficiency     Past Surgical History:  Procedure Laterality Date  . eye cyst removed Left    as a child  . MOUTH SURGERY      Family History  Problem Relation Age of Onset  . Heart attack Father        died at 62  . Colon cancer Neg Hx     Social History   Socioeconomic History  . Marital status: Married    Spouse name: Not on file  . Number of children: 0  . Years of education: Not on file  . Highest education level: Not on file   Occupational History  . Occupation: [employer]  Social Needs  . Financial resource strain: Not on file  . Food insecurity    Worry: Not on file    Inability: Not on file  . Transportation needs    Medical: Not on file    Non-medical: Not on file  Tobacco Use  . Smoking status: Never Smoker  . Smokeless tobacco: Never Used  Substance and Sexual Activity  . Alcohol use: No  . Drug use: No  . Sexual activity: Not on file  Lifestyle  . Physical activity    Days per week: Not on file    Minutes per session: Not on file  . Stress: Not on file  Relationships  . Social Herbalist on phone: Not on file    Gets together: Not on file    Attends religious service: Not on file    Active member of club or organization: Not on file    Attends meetings of clubs or organizations: Not on file    Relationship status: Not on file  . Intimate partner violence    Fear of current or ex partner: Not on file    Emotionally abused: Not on file    Physically abused: Not on file    Forced  sexual activity: Not on file  Other Topics Concern  . Not on file  Social History Narrative  . Not on file    Outpatient Medications Prior to Visit  Medication Sig Dispense Refill  . atorvastatin (LIPITOR) 20 MG tablet Take 1 tablet (20 mg total) by mouth daily. 90 tablet 3  . B Complex-C (SUPER B COMPLEX PO) Take 1 tablet by mouth daily.    . B-D 3CC LUER-LOK SYR 25GX1" 25G X 1" 3 ML MISC USE WITH B12 INJECTIONS 8 each 0  . bismuth subsalicylate (PEPTO BISMOL) 262 MG/15ML suspension Take 30 mLs by mouth every 6 (six) hours as needed for indigestion.    . calcium carbonate (TUMS - DOSED IN MG ELEMENTAL CALCIUM) 500 MG chewable tablet Chew 1 tablet by mouth daily as needed for indigestion or heartburn.    . cyanocobalamin (,VITAMIN B-12,) 1000 MCG/ML injection INJECT 1 mL ONCE WEEKLY FOR 4 WEEKS 10 mL 0  . ferrous sulfate 325 (65 FE) MG EC tablet Take 1 tablet (325 mg total) by mouth 3 (three) times  daily with meals. 90 tablet 0  . Insulin Syringe-Needle U-100 (RELI-ON INS SYR 1CC/30G) 30G 1 ML MISC Use with B12 injections. 8 each 0  . sucralfate (CARAFATE) 1 g tablet Take 1 tablet (1 g total) by mouth 4 (four) times daily -  with meals and at bedtime. Dissolve into liquid and drink 120 tablet 3  . Syringe, Disposable, 1 ML MISC Use with B12 injections 8 each 0  . vitamin E 200 UNIT capsule Take 200 Units by mouth daily.     No facility-administered medications prior to visit.     No Known Allergies  ROS Review of Systems  Constitutional: Negative for fever and unexpected weight change.  HENT: Negative.   Eyes: Negative.   Respiratory: Negative.   Cardiovascular: Negative for leg swelling.  Gastrointestinal: Negative.   Endocrine: Negative.   Genitourinary: Negative.   Musculoskeletal: Negative.   Skin: Negative.   Allergic/Immunologic: Negative.   Neurological: Negative.   Hematological: Negative.   Psychiatric/Behavioral: Negative.       Objective:    Physical Exam  Constitutional: He is oriented to person, place, and time. He appears well-developed and well-nourished.  HENT:  Head: Normocephalic.  Eyes: Pupils are equal, round, and reactive to light.  Neck: Normal range of motion.  Cardiovascular: Normal rate and regular rhythm.  Pulmonary/Chest: Effort normal and breath sounds normal.  Abdominal: Soft. Bowel sounds are normal.  Musculoskeletal: Normal range of motion.  Neurological: He is alert and oriented to person, place, and time.  Skin: Skin is warm and dry.  Psychiatric: He has a normal mood and affect. His behavior is normal. Judgment and thought content normal.    BP (!) 146/88 (BP Location: Left Arm, Patient Position: Sitting, Cuff Size: Normal) Comment: manually  Pulse 62   Temp 97.9 F (36.6 C) (Oral)   Resp 16   Ht 5' 8" (1.727 m)   Wt 214 lb (97.1 kg)   SpO2 97%   BMI 32.54 kg/m  Wt Readings from Last 3 Encounters:  11/14/18 214 lb (97.1  kg)  08/15/17 205 lb (93 kg)  02/14/17 193 lb (87.5 kg)   No results found for: TSH Lab Results  Component Value Date   WBC 5.7 08/17/2017   HGB 14.4 08/17/2017   HCT 44.0 08/17/2017   MCV 88 08/17/2017   PLT 171 08/17/2017   Lab Results  Component Value Date   NA  141 08/17/2017   K 4.1 08/17/2017   CHLORIDE 107 12/06/2016   CO2 25 08/17/2017   GLUCOSE 89 08/17/2017   BUN 10 08/17/2017   CREATININE 0.88 08/17/2017   BILITOT 1.2 08/17/2017   ALKPHOS 115 08/17/2017   AST 24 08/17/2017   ALT 22 08/17/2017   PROT 7.2 08/17/2017   ALBUMIN 4.7 08/17/2017   CALCIUM 9.4 08/17/2017   ANIONGAP 7 12/06/2016   EGFR >60 12/06/2016   Lab Results  Component Value Date   CHOL 141 08/17/2017   Lab Results  Component Value Date   HDL 50 08/17/2017   Lab Results  Component Value Date   LDLCALC 75 08/17/2017   Lab Results  Component Value Date   TRIG 82 08/17/2017   Lab Results  Component Value Date   CHOLHDL 2.8 08/17/2017   Lab Results  Component Value Date   HGBA1C 4.6 11/12/2016      Assessment & Plan:   Problem List Items Addressed This Visit    None     1. B12 deficiency Patient has a history of B12 deficiency.  Patient was previously on B12 injections, however he has been taking over-the-counter B vitamins for the past several months. -Recheck B12 level today  2. Symptomatic anemia Gregory Duncan has a history of symptomatic anemia.  He continues to take over-the-counter iron.  Patient advised to continue and recommend an iron rich diet.  Discussed food options at length.  3. Screening cholesterol level The 10-year ASCVD risk score Mikey Bussing DC Jr., et al., 2013) is: 13.5%   Values used to calculate the score:     Age: 60 years     Sex: Male     Is Non-Hispanic African American: No     Diabetic: No     Tobacco smoker: No     Systolic Blood Pressure: 073 mmHg     Is BP treated: No     HDL Cholesterol: 47 mg/dL     Total Cholesterol: 220 mg/dL Lipid Panel      Component Value Date/Time   CHOL 220 (H) 11/14/2018 0847   TRIG 127 11/14/2018 0847   HDL 47 11/14/2018 0847   CHOLHDL 4.7 11/14/2018 0847   CHOLHDL 4.0 11/19/2016 0827   LDLCALC 150 (H) 11/14/2018 0847   LDLCALC 146 (H) 11/19/2016 0827   LABVLDL 23 11/14/2018 0847    4. History of anemia -Repeat CBC with differential   5. Screening for diabetes mellitus Hemoglobin a1C 5.4. Recheck in 1 year.  - HgB A1c  Follow-up: Return in about 6 months (around 05/15/2019).     Donia Pounds  APRN, MSN, FNP-C Patient [facility] [address] [place], [place] [address] [phone]

## 2018-11-14 NOTE — Progress Notes (Signed)
  Lab Orders     Lipid Panel     CBC w/Diff/Platelet     Comprehensive metabolic panel     123456    Donia Pounds  APRN, MSN, FNP-C Patient [facility] [address] [place], [place] [address] [phone]

## 2018-11-15 ENCOUNTER — Other Ambulatory Visit: Payer: Self-pay

## 2018-11-15 ENCOUNTER — Telehealth: Payer: Self-pay

## 2018-11-15 DIAGNOSIS — E785 Hyperlipidemia, unspecified: Secondary | ICD-10-CM

## 2018-11-15 LAB — CBC WITH DIFFERENTIAL/PLATELET
Basophils Absolute: 0 10*3/uL (ref 0.0–0.2)
Basos: 1 %
EOS (ABSOLUTE): 0.1 10*3/uL (ref 0.0–0.4)
Eos: 2 %
Hematocrit: 43.4 % (ref 37.5–51.0)
Hemoglobin: 14.4 g/dL (ref 13.0–17.7)
Immature Grans (Abs): 0 10*3/uL (ref 0.0–0.1)
Immature Granulocytes: 0 %
Lymphocytes Absolute: 1.9 10*3/uL (ref 0.7–3.1)
Lymphs: 30 %
MCH: 29.9 pg (ref 26.6–33.0)
MCHC: 33.2 g/dL (ref 31.5–35.7)
MCV: 90 fL (ref 79–97)
Monocytes Absolute: 0.5 10*3/uL (ref 0.1–0.9)
Monocytes: 9 %
Neutrophils Absolute: 3.6 10*3/uL (ref 1.4–7.0)
Neutrophils: 58 %
Platelets: 166 10*3/uL (ref 150–450)
RBC: 4.81 x10E6/uL (ref 4.14–5.80)
RDW: 12.9 % (ref 11.6–15.4)
WBC: 6.1 10*3/uL (ref 3.4–10.8)

## 2018-11-15 LAB — COMPREHENSIVE METABOLIC PANEL
ALT: 36 IU/L (ref 0–44)
AST: 28 IU/L (ref 0–40)
Albumin/Globulin Ratio: 1.8 (ref 1.2–2.2)
Albumin: 4.4 g/dL (ref 3.8–4.8)
Alkaline Phosphatase: 111 IU/L (ref 39–117)
BUN/Creatinine Ratio: 15 (ref 10–24)
BUN: 14 mg/dL (ref 8–27)
Bilirubin Total: 1.2 mg/dL (ref 0.0–1.2)
CO2: 22 mmol/L (ref 20–29)
Calcium: 9 mg/dL (ref 8.6–10.2)
Chloride: 105 mmol/L (ref 96–106)
Creatinine, Ser: 0.91 mg/dL (ref 0.76–1.27)
GFR calc Af Amer: 104 mL/min/{1.73_m2} (ref >59)
GFR calc non Af Amer: 90 mL/min/{1.73_m2} (ref >59)
Globulin, Total: 2.4 g/dL (ref 1.5–4.5)
Glucose: 102 mg/dL — ABNORMAL HIGH (ref 65–99)
Potassium: 4.1 mmol/L (ref 3.5–5.2)
Sodium: 141 mmol/L (ref 134–144)
Total Protein: 6.8 g/dL (ref 6.0–8.5)

## 2018-11-15 LAB — LIPID PANEL
Chol/HDL Ratio: 4.7 ratio (ref 0.0–5.0)
Cholesterol, Total: 220 mg/dL — ABNORMAL HIGH (ref 100–199)
HDL: 47 mg/dL (ref >39)
LDL Chol Calc (NIH): 150 mg/dL — ABNORMAL HIGH (ref 0–99)
Triglycerides: 127 mg/dL (ref 0–149)
VLDL Cholesterol Cal: 23 mg/dL (ref 5–40)

## 2018-11-15 LAB — VITAMIN B12: Vitamin B-12: 656 pg/mL (ref 232–1245)

## 2018-11-15 MED ORDER — ATORVASTATIN CALCIUM 20 MG PO TABS: 20.0000 mg | ORAL_TABLET | Freq: Every day | ORAL | 3 refills | Status: AC

## 2018-11-15 NOTE — Telephone Encounter (Signed)
Called, no answer. Left a message to call back. Thanks!  

## 2018-11-15 NOTE — Telephone Encounter (Signed)
-----   Message from Dorena Dew, [place] sent at 11/15/2018  6:29 AM EDT ----- Regarding: lab results Please inform Gregory Duncan that his B12 levels are within a normal range. He can continue OTC B vitamins, but no longer warrants injections.   Cholesterol is elevated. Total cholesterol is 220, goal is < 200 and LDL is 150, goal is < 100. Recommend a low fat, low carbohydrate diet divided over 5-6 small meals, increase water intake to 64 ounces per day, and exercise 5 days per week. Also, start over the counter fish oil tablet every evening. Will recheck fasting cholesterol in 6 months, if not improved will consider adding medication.    Donia Pounds  APRN, MSN, FNP-C Patient Gregory Duncan [address] [place], [place] [address] [phone]

## 2018-11-15 NOTE — Telephone Encounter (Signed)
Called and spoke with patient, advised that b 12 levels are within normal range and to discontinue injections but to continue otc oral vitamin B12. Advised that cholesterol was slightly elevated and that he should eat a low fat/low carb diet over 5 to 6 small meals daily, drink 64 oz of water daily and try to exercise 5 days a week. Advised that he can start otc fish oil once daily in the evenings to help with cholesterol and we will recheck in 6 months. Thanks!

## 2019-01-12 ENCOUNTER — Other Ambulatory Visit: Payer: Self-pay

## 2019-01-12 ENCOUNTER — Emergency Department (HOSPITAL_COMMUNITY)
Admission: EM | Admit: 2019-01-12 | Discharge: 2019-01-12 | Disposition: A | Discharge status: Home / Self Care | Payer: BC Managed Care – PPO | Attending: Emergency Medicine | Admitting: Emergency Medicine

## 2019-01-12 ENCOUNTER — Encounter (HOSPITAL_COMMUNITY): Payer: Self-pay | Admitting: Emergency Medicine

## 2019-01-12 DIAGNOSIS — L0291 Cutaneous abscess, unspecified: Secondary | ICD-10-CM

## 2019-01-12 DIAGNOSIS — Z79899 Other long term (current) drug therapy: Secondary | ICD-10-CM | POA: Diagnosis not present

## 2019-01-12 DIAGNOSIS — L0211 Cutaneous abscess of neck: Secondary | ICD-10-CM | POA: Insufficient documentation

## 2019-01-12 DIAGNOSIS — L03221 Cellulitis of neck: Secondary | ICD-10-CM | POA: Diagnosis not present

## 2019-01-12 DIAGNOSIS — M542 Cervicalgia: Secondary | ICD-10-CM | POA: Diagnosis present

## 2019-01-12 LAB — BASIC METABOLIC PANEL
Anion gap: 11 (ref 5–15)
BUN: 14 mg/dL (ref 8–23)
CO2: 22 mmol/L (ref 22–32)
Calcium: 8.7 mg/dL — ABNORMAL LOW (ref 8.9–10.3)
Chloride: 106 mmol/L (ref 98–111)
Creatinine, Ser: 0.85 mg/dL (ref 0.61–1.24)
GFR calc Af Amer: >60 mL/min (ref >60)
GFR calc non Af Amer: >60 mL/min (ref >60)
Glucose, Bld: 107 mg/dL — ABNORMAL HIGH (ref 70–99)
Potassium: 3.6 mmol/L (ref 3.5–5.1)
Sodium: 139 mmol/L (ref 135–145)

## 2019-01-12 LAB — CBC
HCT: 43 % (ref 39.0–52.0)
Hemoglobin: 14.5 g/dL (ref 13.0–17.0)
MCH: 30.6 pg (ref 26.0–34.0)
MCHC: 33.7 g/dL (ref 30.0–36.0)
MCV: 90.7 fL (ref 80.0–100.0)
Platelets: 184 10*3/uL (ref 150–400)
RBC: 4.74 MIL/uL (ref 4.22–5.81)
RDW: 12.1 % (ref 11.5–15.5)
WBC: 7.3 10*3/uL (ref 4.0–10.5)
nRBC: 0 % (ref 0.0–0.2)

## 2019-01-12 MED ORDER — CLINDAMYCIN HCL 150 MG PO CAPS: 450.0000 mg | ORAL_CAPSULE | Freq: Three times a day (TID) | ORAL | 0 refills | Status: AC

## 2019-01-12 NOTE — Discharge Instructions (Signed)
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  Despite the drainage at the urgent care and antibiotics, it still appears to be infected.  We were able to get more purulence out and flushed it out and washed it.  Our ultrasound not show significant abscess still present.  There was scar tissue seen.  Please stop taking the doxycycline we will switch you to clindamycin as a new antibiotic.  You also need to follow-up with general surgery for likely removal of the chronic cyst that keeps getting infected.  If you start feeling worse with more fevers, chills, nausea, vomiting, or other symptoms, please return to the nearest emergency department.

## 2019-01-12 NOTE — ED Provider Notes (Signed)
[facility] EMERGENCY DEPARTMENT Provider Note   CSN: [id number] Arrival date & time: 01/12/19  1442     History Chief Complaint  Patient presents with  . Abscess    Gregory Duncan is a 63 y.o. male.  The history is provided by the patient and medical records. No language interpreter was used.  Abscess Location:  Head/neck Head/neck abscess location:  L neck Size:  2cm Abscess quality: draining, induration (cm), painful and redness   Red streaking: no   Duration:  7 days Progression:  Unchanged Pain details:    Quality:  Dull Chronicity:  Recurrent Context: not diabetes   Relieved by:  Nothing Worsened by:  Nothing Ineffective treatments:  Draining/squeezing Associated symptoms: no fatigue, no fever, no headaches, no nausea and no vomiting   Risk factors: prior abscess        Past Medical History:  Diagnosis Date  . Anemia   . Blood transfusion without reported diagnosis   . GERD (gastroesophageal reflux disease)   . Hiatal hernia   . Hyperlipidemia   . Vitamin B12 deficiency     Patient Active Problem List   Diagnosis Date Noted  . Diabetes mellitus screening 11/14/2018  . History of anemia 11/14/2018  . B12 deficiency 11/12/2016  . Gastroesophageal reflux disease without esophagitis 11/12/2016  . Symptomatic anemia 10/20/2016  . Pancytopenia ([place]) 10/20/2016    Past Surgical History:  Procedure Laterality Date  . eye cyst removed Left    as a child  . MOUTH SURGERY         Family History  Problem Relation Age of Onset  . Heart attack Father        died at 26  . Colon cancer Neg Hx     Social History   Tobacco Use  . Smoking status: Never Smoker  . Smokeless tobacco: Never Used  Substance Use Topics  . Alcohol use: No  . Drug use: No    Home Medications Prior to Admission medications   Medication Sig Start Date End Date Taking? Authorizing Provider  atorvastatin (LIPITOR) 20 MG tablet Take 1 tablet (20 mg  total) by mouth daily. 11/15/18   Dorena Dew, FNP  B Complex-C (SUPER B COMPLEX PO) Take 1 tablet by mouth daily.    [provider]  B-D 3CC LUER-LOK SYR 25GX1" 25G X 1" 3 ML MISC USE WITH B12 INJECTIONS 03/30/18   Lanae Boast, FNP  bismuth subsalicylate (PEPTO BISMOL) 262 MG/15ML suspension Take 30 mLs by mouth every 6 (six) hours as needed for indigestion.    [provider]  calcium carbonate (TUMS - DOSED IN MG ELEMENTAL CALCIUM) 500 MG chewable tablet Chew 1 tablet by mouth daily as needed for indigestion or heartburn.    [provider]  cyanocobalamin (,VITAMIN B-12,) 1000 MCG/ML injection INJECT 1 mL ONCE WEEKLY FOR 4 WEEKS 08/15/17   Lanae Boast, FNP  ferrous sulfate 325 (65 FE) MG EC tablet Take 1 tablet (325 mg total) by mouth 3 (three) times daily with meals. 08/15/17   Lanae Boast, FNP  Insulin Syringe-Needle U-100 (RELI-ON INS SYR 1CC/30G) 30G 1 ML MISC Use with B12 injections. 10/22/16   Mikhail, Velta Addison, DO  sucralfate (CARAFATE) 1 g tablet Take 1 tablet (1 g total) by mouth 4 (four) times daily -  with meals and at bedtime. Dissolve into liquid and drink 08/15/17   Lanae Boast, FNP  Syringe, Disposable, 1 ML MISC Use with B12 injections 08/15/17  Lanae Boast, FNP  vitamin E 200 UNIT capsule Take 200 Units by mouth daily.    [provider]    Allergies    Patient has no known allergies.  Review of Systems   Review of Systems  Constitutional: Negative for chills, diaphoresis, fatigue and fever.  HENT: Negative for congestion.   Respiratory: Negative for cough, chest tightness, shortness of breath and wheezing.   Gastrointestinal: Negative for abdominal pain, nausea and vomiting.  Genitourinary: Negative for dysuria and flank pain.  Musculoskeletal: Negative for back pain, neck pain (just pain at site) and neck stiffness.  Neurological: Negative for light-headedness and headaches.  Psychiatric/Behavioral: Negative for  agitation.  All other systems reviewed and are negative.   Physical Exam Updated Vital Signs BP (!) 167/101 (BP Location: Left Arm)   Pulse 70   Temp 98.5 F (36.9 C) (Oral)   Resp (!) 23   SpO2 98%   Physical Exam Vitals and nursing note reviewed.  Constitutional:      General: He is not in acute distress.    Appearance: He is well-developed. He is not ill-appearing, toxic-appearing or diaphoretic.  HENT:     Head: Normocephalic and atraumatic.     Nose: No congestion.     Mouth/Throat:     Mouth: Mucous membranes are moist.     Pharynx: No oropharyngeal exudate or posterior oropharyngeal erythema.  Eyes:     Extraocular Movements: Extraocular movements intact.     Conjunctiva/sclera: Conjunctivae normal.     Pupils: Pupils are equal, round, and reactive to light.  Neck:   Cardiovascular:     Rate and Rhythm: Normal rate and regular rhythm.     Pulses: Normal pulses.     Heart sounds: No murmur.  Pulmonary:     Effort: Pulmonary effort is normal. No respiratory distress.     Breath sounds: Normal breath sounds.  Abdominal:     General: Abdomen is flat.     Palpations: Abdomen is soft.     Tenderness: There is no abdominal tenderness.  Musculoskeletal:        General: Tenderness present.     Cervical back: Neck supple. Erythema present. No rigidity.  Skin:    General: Skin is warm and dry.     Capillary Refill: Capillary refill takes less than 2 seconds.     Findings: Erythema present.  Neurological:     General: No focal deficit present.     Mental Status: He is alert.  Psychiatric:        Mood and Affect: Mood normal.     ED Results / Procedures / Treatments   Labs (all labs ordered are listed, but only abnormal results are displayed) Labs Reviewed  BASIC METABOLIC PANEL - Abnormal; Notable for the following components:      Result Value   Glucose, Bld 107 (*)    Calcium 8.7 (*)    All other components within normal limits  CBC     EKG None  Radiology No results found.  Procedures Procedures (including critical care time)  EMERGENCY DEPARTMENT US SOFT TISSUE INTERPRETATION "Study: Limited Soft Tissue Ultrasound"  INDICATIONS: Soft tissue infection Multiple views of the body part were obtained in real-time with a multi-frequency linear probe  PERFORMED BY: Myself IMAGES ARCHIVED?: No  SIDE:Left BODY PART:neck INTERPRETATION:  No abcess noted and Cellulitis present - no further abscess present. Cellulitis cobblestoning seen.    Medications Ordered in ED Medications - No data to display  ED  Course  I have reviewed the triage vital signs and the nursing notes.  Pertinent labs & imaging results that were available during my care of the patient were reviewed by me and considered in my medical decision making (see chart for details).    MDM Rules/Calculators/A&P                         SYRUS STAUSS is a 63 y.o. male with a past medical history significant for GERD, hiatal hernia, hyperlipidemia, chronic anemia, and chronic neck cyst who presents with pain and drainage from his cyst.  He reports that over the last 5 years he has had a cyst on the back left side of his neck that has been aspirated and drained multiple times and has been infected several times.  He reports that last week he went to an urgent care and they performed an incision and drainage and started him on doxycycline.  He reports no fevers, chills, chest pain, shortness of breath, nausea, vomiting, or any other infectious symptoms but reports the pain is worsened and has been draining pus.  He requested to be reevaluated as the antibiotics not seem to be helping.  He denies other complaints on arrival.  On exam, patient does have appears to be an abscess on the back of his neck on the left.  When I examined it, I was able to express a large amount of purulence.  I was able to get a large amount of pus out and washed it with normal  saline.  Ultrasound was utilized and I did not see further pockets of abscess.  Suspect a significant mount of scar tissue however.  Patient did not have neck stiffness and had no neurologic deficits.  Doubt infection tracking down into his neck causing any deep space neck infection or spinal infection.  Patient did have erythema surrounding, do not feel the doxycycline is fully covering at this time.  We will switch the patient to clindamycin and as his wound does not appear to have any further purulence, will dress it and allow to continue draining.  He will be referred to general surgery for likely cyst removal at some point after the infection has improved.  His labs were performed in triage and did not show evidence of sepsis or worsened infection.  Patient was feeling much better after the purulence was removed.  Patient had no other questions or concerns and understands return precautions.  Patient was discharged in good condition with resolution of the neck pain.      Final Clinical Impression(s) / ED Diagnoses Final diagnoses:  Abscess  Cellulitis of neck    Rx / DC Orders ED Discharge Orders         Ordered    clindamycin (CLEOCIN) 150 MG capsule  3 times daily     01/12/19 2002          Clinical Impression: 1. Abscess   2. Cellulitis of neck     Disposition: Discharge  Condition: Good  I have discussed the results, Dx and Tx plan with the pt(& family if present). He/she/they expressed understanding and agree(s) with the plan. Discharge instructions discussed at great length. Strict return precautions discussed and pt &/or family have verbalized understanding of the instructions. No further questions at time of discharge.    New Prescriptions   CLINDAMYCIN (CLEOCIN) 150 MG CAPSULE    Take 3 capsules (450 mg total) by mouth 3 (three) times  daily for 10 days.    Follow Up: Surgery, [facility] [address] [place]  [address] [phone]        Mahira Petras, Gwenyth Allegra, MD 01/13/19 [phone]

## 2019-01-12 NOTE — ED Notes (Signed)
Patient verbalizes understanding of discharge instructions. Opportunity for questioning and answers were provided. Armband removed by staff, pt discharged from ED ambulatory.   

## 2019-01-12 NOTE — ED Triage Notes (Signed)
Patient states he has had a small cyst on the back of his neck for about 5 years but recently started swelling and draining. Posterior neck is red and swollen. Patient adds he went to UC last week and they drained it and started him on doxycycline.

## 2019-01-30 ENCOUNTER — Ambulatory Visit: Payer: Self-pay | Admitting: Surgery

## 2019-04-19 ENCOUNTER — Encounter: Payer: Self-pay | Admitting: Nurse Practitioner

## 2019-04-19 ENCOUNTER — Other Ambulatory Visit: Payer: Self-pay

## 2019-04-19 ENCOUNTER — Ambulatory Visit (INDEPENDENT_AMBULATORY_CARE_PROVIDER_SITE_OTHER): Payer: BC Managed Care – PPO | Admitting: Nurse Practitioner

## 2019-04-19 VITALS — BP 136/62 | HR 63 | Temp 98.6°F | Ht 68.0 in | Wt 211.8 lb

## 2019-04-19 DIAGNOSIS — M545 Low back pain, unspecified: Secondary | ICD-10-CM

## 2019-04-19 MED ORDER — CYCLOBENZAPRINE HCL 5 MG PO TABS: 5.0000 mg | ORAL_TABLET | Freq: Three times a day (TID) | ORAL | 1 refills | Status: AC | PRN

## 2019-04-19 NOTE — Progress Notes (Signed)
Subjective:    Gregory Duncan is a 64 y.o. male who presents for evaluation of low back pain. The patient has had no prior back problems. Symptoms have been present for 4 days ago and are getting worse.  Onset was related to / precipitated by no known injury. The pain is located in the lower back pain and does not radiate. The pain is described as sever pain and occurs intermittently.No current pain.  Symptoms are exacerbated by extension and flexion. His pain got worse on Tuesday. He was " frozen in place" Wednesday in the bed all day. He had to slide his around to move. Symptoms are improved by acetaminophen, heat and rest. He has also tried nothing and any other treatment which provided no symptom relief. He has no other symptoms associated with the back pain.   He did have the [place] on Monday. He has some soreness in his right upper arm. He is unsure if it a reaction to the [facility] or the a pulled muscle.  He works at [employer] at [employer].   Review of Systems Pertinent items noted in HPI and remainder of comprehensive ROS otherwise negative.    Objective:   Full range of motion without pain, no tenderness, no spasm, no curvature. Normal reflexes, gait, strength and negative straight-leg raise.    Assessment:    Nonspecific acute low back pain    Plan:    Educational material distributed. Stretching exercises discussed. Ice to affected area as needed for local pain relief. Heat to affected area as needed for local pain relief. Muscle relaxants per medication orders.

## 2019-04-19 NOTE — Patient Instructions (Signed)
Acute Back Pain, Adult Acute back pain is sudden and usually short-lived. It is often caused by an injury to the muscles and tissues in the back. The injury may result from:  A muscle or ligament getting overstretched or torn (strained). Ligaments are tissues that connect bones to each other. Lifting something improperly can cause a back strain.  Wear and tear (degeneration) of the spinal disks. Spinal disks are circular tissue that provides cushioning between the bones of the spine (vertebrae).  Twisting motions, such as while playing sports or doing yard work.  A hit to the back.  Arthritis. You may have a physical exam, lab tests, and imaging tests to find the cause of your pain. Acute back pain usually goes away with rest and home care. Follow these instructions at home: Managing pain, stiffness, and swelling  Take over-the-counter and prescription medicines only as told by your health care provider.  Your health care provider may recommend applying ice during the first 24-48 hours after your pain starts. To do this: ? Put ice in a plastic bag. ? Place a towel between your skin and the bag. ? Leave the ice on for 20 minutes, 2-3 times a day.  If directed, apply heat to the affected area as often as told by your health care provider. Use the heat source that your health care provider recommends, such as a moist heat pack or a heating pad. ? Place a towel between your skin and the heat source. ? Leave the heat on for 20-30 minutes. ? Remove the heat if your skin turns bright red. This is especially important if you are unable to feel pain, heat, or cold. You have a greater risk of getting burned. Activity   Do not stay in bed. Staying in bed for more than 1-2 days can delay your recovery.  Sit up and stand up straight. Avoid leaning forward when you sit, or hunching over when you stand. ? If you work at a desk, sit close to it so you do not need to lean over. Keep your chin tucked  in. Keep your neck drawn back, and keep your elbows bent at a right angle. Your arms should look like the letter "L." ? Sit high and close to the steering wheel when you drive. Add lower back (lumbar) support to your car seat, if needed.  Take short walks on even surfaces as soon as you are able. Try to increase the length of time you walk each day.  Do not sit, drive, or stand in one place for more than 30 minutes at a time. Sitting or standing for long periods of time can put stress on your back.  Do not drive or use heavy machinery while taking prescription pain medicine.  Use proper lifting techniques. When you bend and lift, use positions that put less stress on your back: ? Bend your knees. ? Keep the load close to your body. ? Avoid twisting.  Exercise regularly as told by your health care provider. Exercising helps your back heal faster and helps prevent back injuries by keeping muscles strong and flexible.  Work with a physical therapist to make a safe exercise program, as recommended by your health care provider. Do any exercises as told by your physical therapist. Lifestyle  Maintain a healthy weight. Extra weight puts stress on your back and makes it difficult to have good posture.  Avoid activities or situations that make you feel anxious or stressed. Stress and anxiety increase muscle   tension and can make back pain worse. Learn ways to manage anxiety and stress, such as through exercise. General instructions  Sleep on a firm mattress in a comfortable position. Try lying on your side with your knees slightly bent. If you lie on your back, put a pillow under your knees.  Follow your treatment plan as told by your health care provider. This may include: ? Cognitive or behavioral therapy. ? Acupuncture or massage therapy. ? Meditation or yoga. Contact a health care provider if:  You have pain that is not relieved with rest or medicine.  You have increasing pain going down  into your legs or buttocks.  Your pain does not improve after 2 weeks.  You have pain at night.  You lose weight without trying.  You have a fever or chills. Get help right away if:  You develop new bowel or bladder control problems.  You have unusual weakness or numbness in your arms or legs.  You develop nausea or vomiting.  You develop abdominal pain.  You feel faint. Summary  Acute back pain is sudden and usually short-lived.  Use proper lifting techniques. When you bend and lift, use positions that put less stress on your back.  Take over-the-counter and prescription medicines and apply heat or ice as directed by your health care provider. This information is not intended to replace advice given to you by your health care provider. Make sure you discuss any questions you have with your health care provider. Document Revised: 05/09/2018 Document Reviewed: 09/01/2016 Elsevier Patient Education  2020 Elsevier Inc.  

## 2019-05-15 ENCOUNTER — Other Ambulatory Visit: Payer: Self-pay

## 2019-05-15 ENCOUNTER — Encounter: Payer: Self-pay | Admitting: Family Medicine

## 2019-05-15 ENCOUNTER — Ambulatory Visit (INDEPENDENT_AMBULATORY_CARE_PROVIDER_SITE_OTHER): Payer: BC Managed Care – PPO | Admitting: Family Medicine

## 2019-05-15 VITALS — BP 144/90 | HR 66 | Temp 98.7°F | Resp 16 | Ht 68.0 in | Wt 212.0 lb

## 2019-05-15 DIAGNOSIS — E538 Deficiency of other specified B group vitamins: Secondary | ICD-10-CM

## 2019-05-15 DIAGNOSIS — Z862 Personal history of diseases of the blood and blood-forming organs and certain disorders involving the immune mechanism: Secondary | ICD-10-CM

## 2019-05-15 DIAGNOSIS — Z8639 Personal history of other endocrine, nutritional and metabolic disease: Secondary | ICD-10-CM

## 2019-05-15 DIAGNOSIS — E785 Hyperlipidemia, unspecified: Secondary | ICD-10-CM

## 2019-05-15 NOTE — Patient Instructions (Signed)

## 2019-05-15 NOTE — Progress Notes (Signed)
[name]   Established Patient Office Visit  Subjective:  Patient ID: Gregory Duncan, male    DOB: 01/31/1956  Age: 64 y.o. MRN: [id number]  CC:  Chief Complaint  Patient presents with  . Follow-up    b-12 deficiency   . Anemia  . Hyperlipidemia    HPI Gregory Duncan, a 64 year old male with a medical history significant for B12 deficiency, anemia of chronic disease, GERD, and hyperlipidemia presents for 26-monthfollow-up of chronic conditions.  Patient states that he has been doing well and is without complaint.  Patient states that he has been eating a balanced diet and remaining active.  He has decreased weight by 2 pounds since previous visit.  He does not have a regular exercise routine, he states that he works a very physical job on third shift at [employer] He currently denies dizziness, paresthesias, headache, chest pain, urinary symptoms, nausea, vomiting, or diarrhea.    Past Medical History:  Diagnosis Date  . Anemia   . Blood transfusion without reported diagnosis   . GERD (gastroesophageal reflux disease)   . Hiatal hernia   . Hyperlipidemia   . Vitamin B12 deficiency     Past Surgical History:  Procedure Laterality Date  . eye cyst removed Left    as a child  . MOUTH SURGERY      Family History  Problem Relation Age of Onset  . Heart attack Father        died at 491 . Colon cancer Neg Hx     Social History   Socioeconomic History  . Marital status: Married    Spouse name: Not on file  . Number of children: 0  . Years of education: Not on file  . Highest education level: Not on file  Occupational History  . Occupation: [employer]  Tobacco Use  . Smoking status: Never Smoker  . Smokeless tobacco: Never Used  Substance and Sexual Activity  . Alcohol use: No  . Drug use: No  . Sexual activity: Not Currently  Other Topics Concern  . Not on file  Social History Narrative  . Not on file    Social Determinants of Health   Financial Resource Strain:   . Difficulty of Paying Living Expenses:   Food Insecurity:   . Worried About RCharity fundraiserin the Last Year:   . RArboriculturistin the Last Year:   Transportation Needs:   . LFilm/video editor(Medical):   .Marland KitchenLack of Transportation (Non-Medical):   Physical Activity:   . Days of Exercise per Week:   . Minutes of Exercise per Session:   Stress:   . Feeling of Stress :   Social Connections:   . Frequency of Communication with Friends and Family:   . Frequency of Social Gatherings with Friends and Family:   . Attends Religious Services:   . Active Member of Clubs or Organizations:   . Attends CArchivistMeetings:   .Marland KitchenMarital Status:   Intimate Partner Violence:   . Fear of Current or Ex-Partner:   . Emotionally Abused:   .Marland KitchenPhysically Abused:   . Sexually Abused:     Outpatient Medications Prior to Visit  Medication Sig Dispense Refill  . atorvastatin (LIPITOR) 20 MG tablet Take 1 tablet (20 mg total) by mouth daily. 90 tablet 3  . B Complex-C (SUPER B COMPLEX PO) Take 1 tablet by mouth  daily.    . B-D 3CC LUER-LOK SYR 25GX1" 25G X 1" 3 ML MISC USE WITH B12 INJECTIONS 8 each 0  . bismuth subsalicylate (PEPTO BISMOL) 262 MG/15ML suspension Take 30 mLs by mouth every 6 (six) hours as needed for indigestion.    . calcium carbonate (TUMS - DOSED IN MG ELEMENTAL CALCIUM) 500 MG chewable tablet Chew 1 tablet by mouth daily as needed for indigestion or heartburn.    . cyanocobalamin (,VITAMIN B-12,) 1000 MCG/ML injection INJECT 1 mL ONCE WEEKLY FOR 4 WEEKS 10 mL 0  . cyclobenzaprine (FLEXERIL) 5 MG tablet Take 1 tablet (5 mg total) by mouth 3 (three) times daily as needed for muscle spasms. 30 tablet 1  . ferrous sulfate 325 (65 FE) MG EC tablet Take 1 tablet (325 mg total) by mouth 3 (three) times daily with meals. 90 tablet 0  . Insulin Syringe-Needle U-100 (RELI-ON INS SYR 1CC/30G) 30G 1 ML MISC Use  with B12 injections. 8 each 0  . sucralfate (CARAFATE) 1 g tablet Take 1 tablet (1 g total) by mouth 4 (four) times daily -  with meals and at bedtime. Dissolve into liquid and drink 120 tablet 3  . Syringe, Disposable, 1 ML MISC Use with B12 injections 8 each 0  . vitamin E 200 UNIT capsule Take 200 Units by mouth daily.     No facility-administered medications prior to visit.    No Known Allergies  ROS Review of Systems  Constitutional: Negative.   HENT: Negative.   Eyes: Negative.   Respiratory: Negative.   Cardiovascular: Negative.   Gastrointestinal: Negative.   Endocrine: Negative.   Musculoskeletal: Positive for back pain.  Skin: Negative.   Neurological: Negative.   Psychiatric/Behavioral: Negative.       Objective:    Physical Exam  Constitutional: He is oriented to person, place, and time. He appears well-developed and well-nourished.  HENT:  Head: Normocephalic.  Eyes: Pupils are equal, round, and reactive to light.  Cardiovascular: Normal rate and regular rhythm.  Pulmonary/Chest: Effort normal and breath sounds normal.  Abdominal: Soft. Bowel sounds are normal.  Musculoskeletal:        General: Normal range of motion.  Neurological: He is alert and oriented to person, place, and time.  Skin: Skin is warm.  Psychiatric: He has a normal mood and affect. His behavior is normal. Judgment and thought content normal.    BP (!) 144/90 (BP Location: Left Arm, Patient Position: Sitting, Cuff Size: Large) Comment: manually  Pulse 66   Temp 98.7 F (37.1 C) (Oral)   Resp 16   Ht 5' 8"  (1.727 m)   Wt 212 lb (96.2 kg)   SpO2 98%   BMI 32.23 kg/m  Wt Readings from Last 3 Encounters:  05/15/19 212 lb (96.2 kg)  04/19/19 211 lb 12.8 oz (96.1 kg)  11/14/18 214 lb (97.1 kg)     There are no preventive care reminders to display for this patient.  There are no preventive care reminders to display for this patient.  No results found for: TSH Lab Results   Component Value Date   WBC 7.3 01/12/2019   HGB 14.5 01/12/2019   HCT 43.0 01/12/2019   MCV 90.7 01/12/2019   PLT 184 01/12/2019   Lab Results  Component Value Date   NA 139 01/12/2019   K 3.6 01/12/2019   CHLORIDE 107 12/06/2016   CO2 22 01/12/2019   GLUCOSE 107 (H) 01/12/2019   BUN 14 01/12/2019   CREATININE  0.85 01/12/2019   BILITOT 1.2 11/14/2018   ALKPHOS 111 11/14/2018   AST 28 11/14/2018   ALT 36 11/14/2018   PROT 6.8 11/14/2018   ALBUMIN 4.4 11/14/2018   CALCIUM 8.7 (L) 01/12/2019   ANIONGAP 11 01/12/2019   EGFR >60 12/06/2016   Lab Results  Component Value Date   CHOL 220 (H) 11/14/2018   Lab Results  Component Value Date   HDL 47 11/14/2018   Lab Results  Component Value Date   LDLCALC 150 (H) 11/14/2018   Lab Results  Component Value Date   TRIG 127 11/14/2018   Lab Results  Component Value Date   CHOLHDL 4.7 11/14/2018   Lab Results  Component Value Date   HGBA1C 5.4 11/14/2018      Assessment & Plan:   Problem List Items Addressed This Visit      Other   History of anemia   Relevant Orders   CBC   B12 deficiency    Other Visit Diagnoses    History of metabolic syndrome    -  Primary   Relevant Orders   Comprehensive metabolic panel   Hyperlipidemia, unspecified hyperlipidemia type       Relevant Orders   Lipid Panel     History of metabolic syndrome The patient is asked to make an attempt to improve diet and exercise patterns to aid in medical management of this problem. Body mass index is 32.23 kg/m. Last Weight  Most recent update: 05/15/2019  8:50 AM   Weight  96.2 kg (212 lb)           - Comprehensive metabolic panel; Future  B12 deficiency -B-12; future  Hyperlipidemia, unspecified hyperlipidemia type The 10-year ASCVD risk score Mikey Bussing DC Jr., et al., 2013) is: 13.8%   Values used to calculate the score:     Age: 70 years     Sex: Male     Is Non-Hispanic African American: No     Diabetic: No     Tobacco  smoker: No     Systolic Blood Pressure: 932 mmHg     Is BP treated: No     HDL Cholesterol: 47 mg/dL     Total Cholesterol: 220 mg/dL - Lipid Panel; Future   History of anemia - CBC; Future  Follow-up: Return in about 6 months (around 11/14/2019) for hyperlipidemia.     Donia Pounds  APRN, MSN, FNP-C Patient [facility] [address] [place], [place] [address] [phone]

## 2019-05-22 ENCOUNTER — Other Ambulatory Visit: Payer: Self-pay

## 2019-05-22 ENCOUNTER — Other Ambulatory Visit: Payer: BC Managed Care – PPO

## 2019-05-22 DIAGNOSIS — Z862 Personal history of diseases of the blood and blood-forming organs and certain disorders involving the immune mechanism: Secondary | ICD-10-CM

## 2019-05-22 DIAGNOSIS — E785 Hyperlipidemia, unspecified: Secondary | ICD-10-CM

## 2019-05-22 DIAGNOSIS — E538 Deficiency of other specified B group vitamins: Secondary | ICD-10-CM

## 2019-05-22 DIAGNOSIS — Z8639 Personal history of other endocrine, nutritional and metabolic disease: Secondary | ICD-10-CM

## 2019-05-23 LAB — CBC
Hematocrit: 44.2 % (ref 37.5–51.0)
Hemoglobin: 14.8 g/dL (ref 13.0–17.7)
MCH: 30 pg (ref 26.6–33.0)
MCHC: 33.5 g/dL (ref 31.5–35.7)
MCV: 90 fL (ref 79–97)
Platelets: 168 10*3/uL (ref 150–450)
RBC: 4.94 x10E6/uL (ref 4.14–5.80)
RDW: 12.9 % (ref 11.6–15.4)
WBC: 6 10*3/uL (ref 3.4–10.8)

## 2019-05-23 LAB — COMPREHENSIVE METABOLIC PANEL
ALT: 37 IU/L (ref 0–44)
AST: 35 IU/L (ref 0–40)
Albumin/Globulin Ratio: 2.1 (ref 1.2–2.2)
Albumin: 4.5 g/dL (ref 3.8–4.8)
Alkaline Phosphatase: 125 IU/L — ABNORMAL HIGH (ref 39–117)
BUN/Creatinine Ratio: 11 (ref 10–24)
BUN: 10 mg/dL (ref 8–27)
Bilirubin Total: 1.5 mg/dL — ABNORMAL HIGH (ref 0.0–1.2)
CO2: 22 mmol/L (ref 20–29)
Calcium: 9.2 mg/dL (ref 8.6–10.2)
Chloride: 104 mmol/L (ref 96–106)
Creatinine, Ser: 0.88 mg/dL (ref 0.76–1.27)
GFR calc Af Amer: 106 mL/min/{1.73_m2} (ref >59)
GFR calc non Af Amer: 92 mL/min/{1.73_m2} (ref >59)
Globulin, Total: 2.1 g/dL (ref 1.5–4.5)
Glucose: 89 mg/dL (ref 65–99)
Potassium: 4 mmol/L (ref 3.5–5.2)
Sodium: 141 mmol/L (ref 134–144)
Total Protein: 6.6 g/dL (ref 6.0–8.5)

## 2019-05-23 LAB — LIPID PANEL
Chol/HDL Ratio: 2.9 ratio (ref 0.0–5.0)
Cholesterol, Total: 141 mg/dL (ref 100–199)
HDL: 48 mg/dL (ref >39)
LDL Chol Calc (NIH): 79 mg/dL (ref 0–99)
Triglycerides: 69 mg/dL (ref 0–149)
VLDL Cholesterol Cal: 14 mg/dL (ref 5–40)

## 2019-05-23 LAB — VITAMIN B12: Vitamin B-12: 978 pg/mL (ref 232–1245)

## 2019-05-24 ENCOUNTER — Telehealth: Payer: Self-pay

## 2019-05-24 NOTE — Telephone Encounter (Signed)
-----   Message from Dorena Dew, [place] sent at 05/23/2019  5:12 PM EDT ----- Regarding: lab results Please inform Mr. Sluka that all laboratory values are within a normal range. No medication changes are warranted at this time. Follow up with [place] in 6 months or as needed.   Donia Pounds  APRN, MSN, FNP-C Patient [facility] [address] [place], [place] [address] [phone]

## 2019-05-24 NOTE — Telephone Encounter (Signed)
Called no answer. Left a message that labs are normal and no changes to medications are needed at this time. Asked that he call back if any questions to call back to our office. Thanks!

## 2019-08-03 IMAGING — CT CT CHEST W/ CM
3 of 5 series · 13 of 36 positions shown, 16 images · IV contrast (Omni 300)
Comparison: None.

CLINICAL DATA: Weakness, fatigue.

EXAM:
CT CHEST, ABDOMEN, AND PELVIS WITH CONTRAST
TECHNIQUE: Multidetector CT imaging of the chest, abdomen and pelvis was
performed following the standard protocol during bolus
administration of intravenous contrast.
CONTRAST:  100mL [id number] IOPAMIDOL ([id number]) INJECTION 61%

[Series 3: cap with 5mm st · axial · 0.77mm/px · z∈[+889,+1409]mm · 9 of 131 slices shown, 12 images]
[im 14/131  mediastinal]
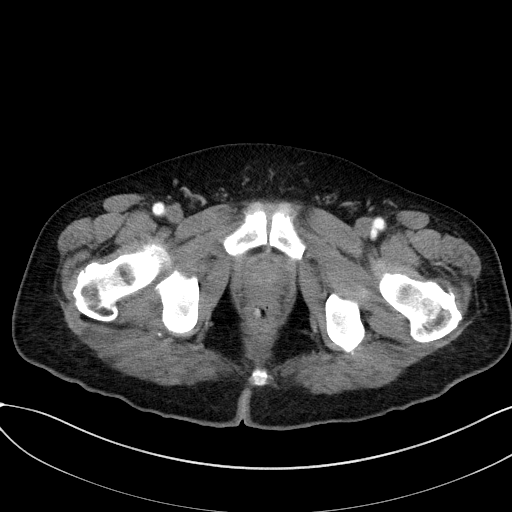
[im 14/131  lung]
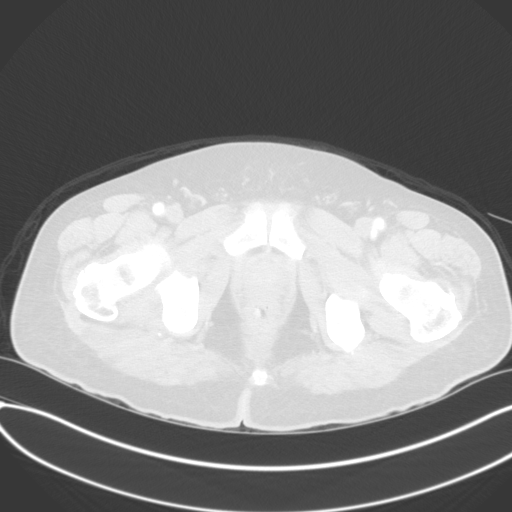
[im 27/131  lung]
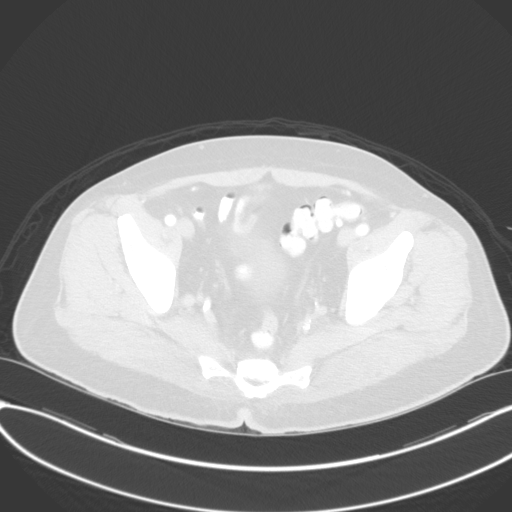
[im 40/131  lung]
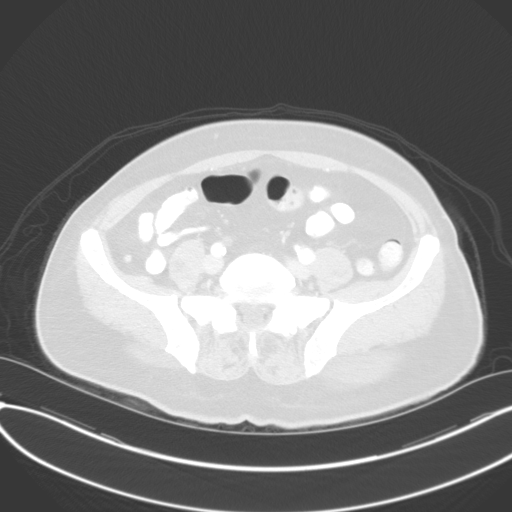
[im 53/131  lung]
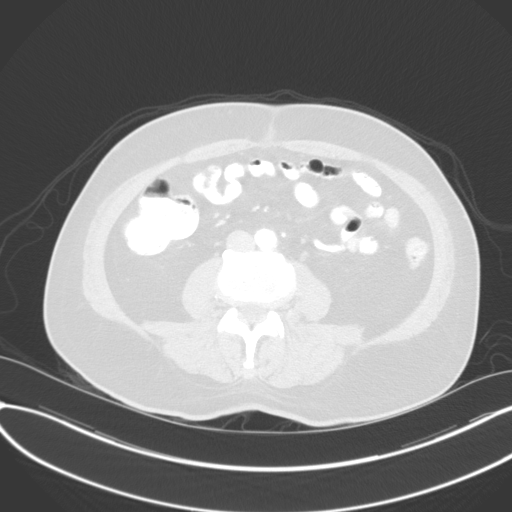
[im 66/131  mediastinal]
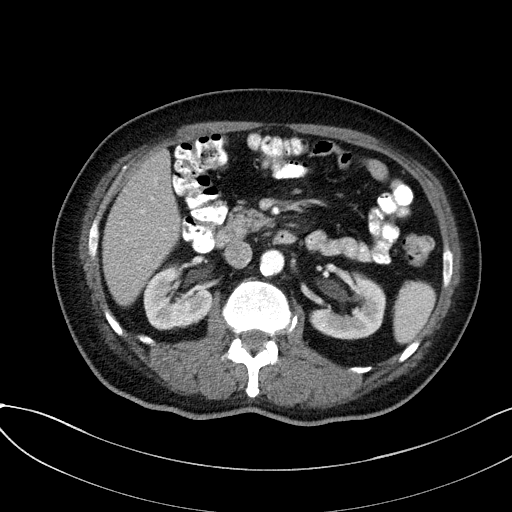
[im 66/131  lung]
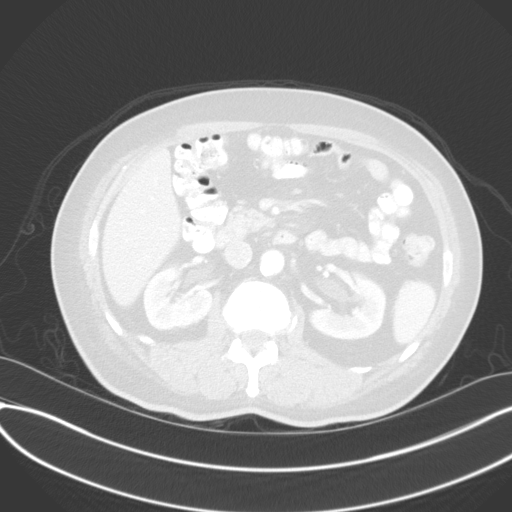
[im 79/131  lung]
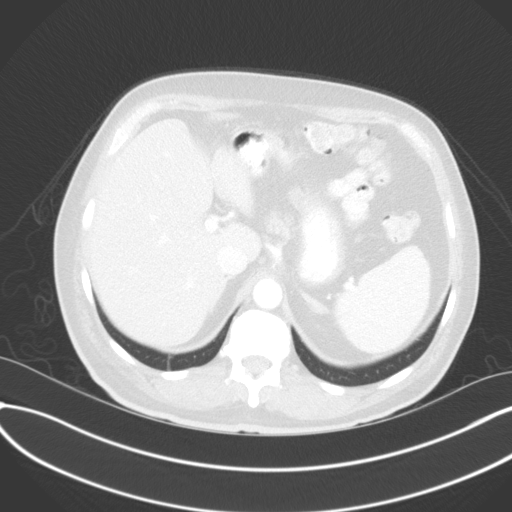
[im 92/131  lung]
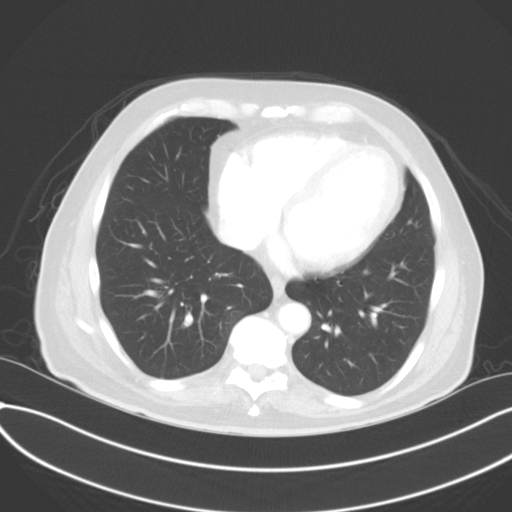
[im 105/131  lung]
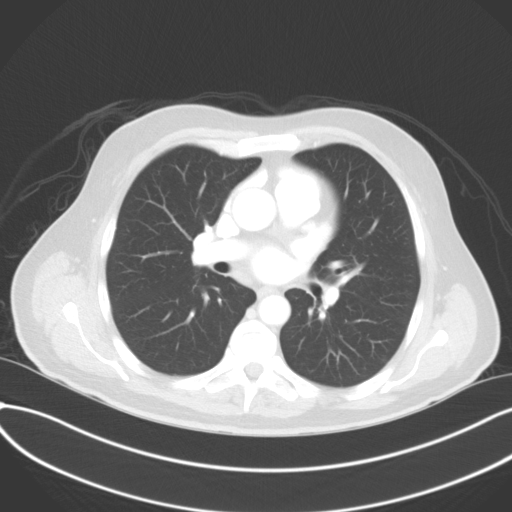
[im 118/131  mediastinal]
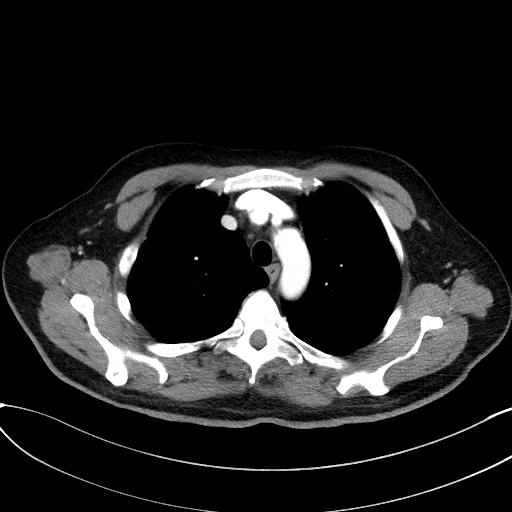
[im 118/131  lung]
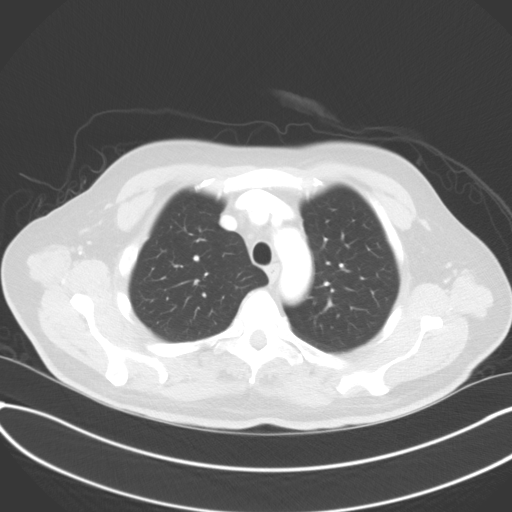

[Series 4: lung · axial · 0.77mm/px · 1 of 153 slices shown]
[im 12/153  lung]
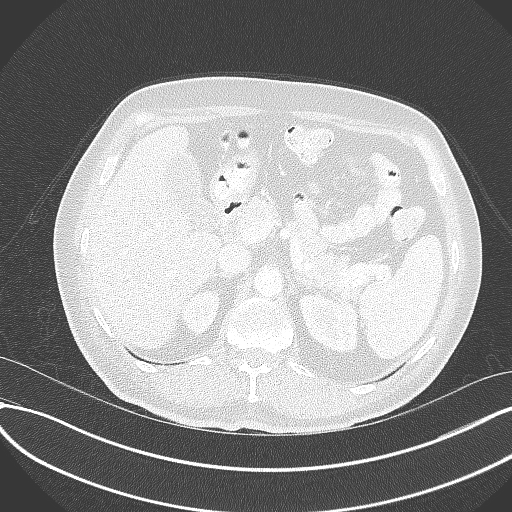

[Series 5: cap with 3mm st cor · coronal · 0.75mm/px · 3 of 150 slices shown]
[im 30/150  lung]
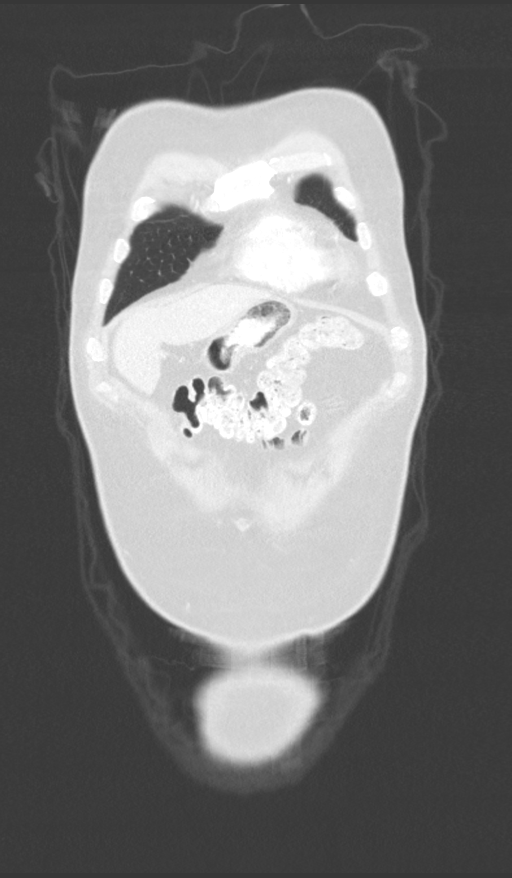
[im 60/150  lung]
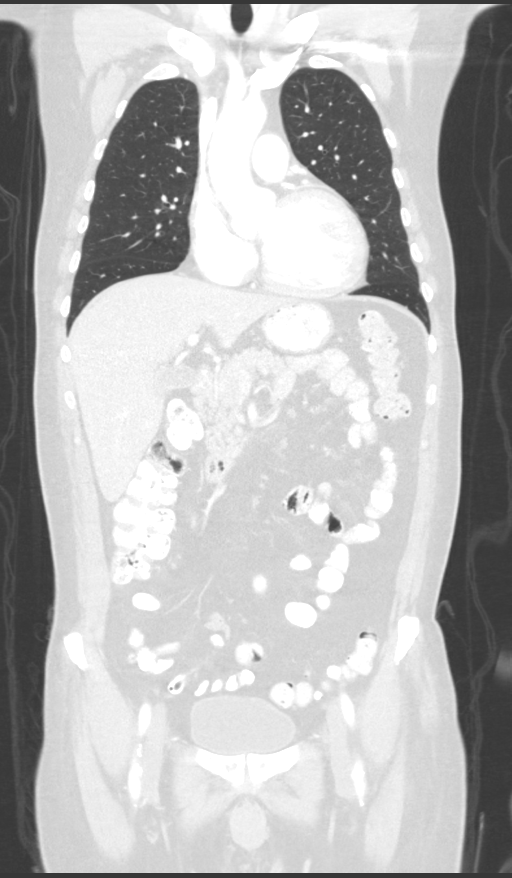
[im 90/150  lung]
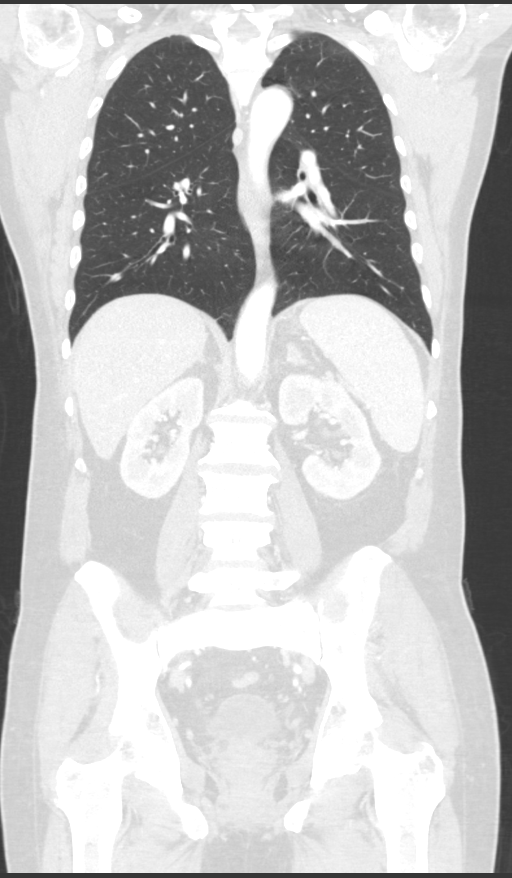

[13 of 36 positions shown; findings below may reference images not displayed]

FINDINGS: CT CHEST FINDINGS

Cardiovascular: Atherosclerosis of thoracic aorta is noted without
aneurysm or dissection. Coronary artery calcifications are noted. No
pericardial effusion is noted.

Mediastinum/Nodes: No enlarged mediastinal, hilar, or axillary lymph
nodes. Thyroid gland, trachea, and esophagus demonstrate no
significant findings.

Lungs/Pleura: Lungs are clear. No pleural effusion or pneumothorax.

Musculoskeletal: No chest wall mass or suspicious bone lesions
identified.

CT ABDOMEN PELVIS FINDINGS

Hepatobiliary: Solitary gallstone is noted. The liver is
unremarkable.

Pancreas: Unremarkable. No pancreatic ductal dilatation or
surrounding inflammatory changes.

Spleen: Normal in size without focal abnormality.

Adrenals/Urinary Tract: Adrenal glands are unremarkable. Kidneys are
normal, without renal calculi, focal lesion, or hydronephrosis.
Bladder is unremarkable.

Stomach/Bowel: Stomach is within normal limits. Appendix appears
normal. No evidence of bowel wall thickening, distention, or
inflammatory changes. Sigmoid diverticulosis is noted without
inflammation.

Vascular/Lymphatic: Aortic atherosclerosis. No enlarged abdominal or
pelvic lymph nodes.

Reproductive: Prostate is unremarkable.

Other: No abdominal wall hernia or abnormality. No abdominopelvic
ascites.

Musculoskeletal: No acute or significant osseous findings.
IMPRESSION: Aortic atherosclerosis.

Coronary artery calcifications are noted consistent coronary artery
disease.

Solitary gallstone is noted.

Sigmoid diverticulosis without inflammation.

No other significant abnormality seen in the abdomen or pelvis.

## 2019-11-13 ENCOUNTER — Ambulatory Visit: Payer: BC Managed Care – PPO | Admitting: Family Medicine

## 2021-11-25 ENCOUNTER — Encounter: Payer: Self-pay | Admitting: Internal Medicine
